# Patient Record
Sex: Female | Born: 1965 | Race: White | Hispanic: No | Marital: Married | Smoking: Former smoker
Health system: Southern US, Community
[De-identification: ages and names within clinical notes are randomized; demographics above are authoritative.]

## PROBLEM LIST (undated history)

## (undated) ENCOUNTER — Encounter: Payer: PRIVATE HEALTH INSURANCE | Attending: Pediatrics | Primary: Pediatrics

## (undated) ENCOUNTER — Ambulatory Visit: Payer: BLUE CROSS/BLUE SHIELD

## (undated) ENCOUNTER — Encounter

## (undated) ENCOUNTER — Ambulatory Visit

## (undated) ENCOUNTER — Ambulatory Visit: Payer: PRIVATE HEALTH INSURANCE | Attending: Internal Medicine | Primary: Internal Medicine

## (undated) ENCOUNTER — Ambulatory Visit: Attending: Pharmacist | Primary: Pharmacist

## (undated) ENCOUNTER — Telehealth

## (undated) ENCOUNTER — Telehealth: Attending: "Women's Health Care | Primary: "Women's Health Care

## (undated) ENCOUNTER — Encounter: Attending: Internal Medicine | Primary: Internal Medicine

## (undated) ENCOUNTER — Encounter: Attending: "Women's Health Care | Primary: "Women's Health Care

## (undated) ENCOUNTER — Ambulatory Visit: Payer: BLUE CROSS/BLUE SHIELD | Attending: "Women's Health Care | Primary: "Women's Health Care

## (undated) ENCOUNTER — Encounter: Attending: Pediatrics | Primary: Pediatrics

## (undated) ENCOUNTER — Telehealth: Attending: Pediatrics | Primary: Pediatrics

## (undated) ENCOUNTER — Ambulatory Visit: Payer: PRIVATE HEALTH INSURANCE

## (undated) ENCOUNTER — Ambulatory Visit
Payer: PRIVATE HEALTH INSURANCE | Attending: Student in an Organized Health Care Education/Training Program | Primary: Student in an Organized Health Care Education/Training Program

## (undated) ENCOUNTER — Ambulatory Visit: Payer: BLUE CROSS/BLUE SHIELD | Attending: Internal Medicine | Primary: Internal Medicine

## (undated) ENCOUNTER — Telehealth: Attending: Internal Medicine | Primary: Internal Medicine

## (undated) ENCOUNTER — Encounter: Payer: PRIVATE HEALTH INSURANCE | Attending: Dermatology | Primary: Dermatology

## (undated) ENCOUNTER — Ambulatory Visit: Attending: Pediatrics | Primary: Pediatrics

## (undated) ENCOUNTER — Ambulatory Visit: Attending: Internal Medicine | Primary: Internal Medicine

## (undated) ENCOUNTER — Ambulatory Visit: Payer: PRIVATE HEALTH INSURANCE | Attending: Family | Primary: Family

## (undated) ENCOUNTER — Ambulatory Visit: Payer: PRIVATE HEALTH INSURANCE | Attending: Dermatology | Primary: Dermatology

## (undated) ENCOUNTER — Telehealth: Attending: Dermatology | Primary: Dermatology

## (undated) ENCOUNTER — Encounter: Attending: Pain Medicine | Primary: Pain Medicine

## (undated) ENCOUNTER — Encounter
Attending: Student in an Organized Health Care Education/Training Program | Primary: Student in an Organized Health Care Education/Training Program

## (undated) ENCOUNTER — Inpatient Hospital Stay

## (undated) ENCOUNTER — Ambulatory Visit: Payer: PRIVATE HEALTH INSURANCE | Attending: Pain Medicine | Primary: Pain Medicine

## (undated) ENCOUNTER — Encounter: Attending: Dermatology | Primary: Dermatology

## (undated) DIAGNOSIS — L309 Dermatitis, unspecified: Secondary | ICD-10-CM

## (undated) DIAGNOSIS — F32A Depression, unspecified: Secondary | ICD-10-CM

## (undated) DIAGNOSIS — G43909 Migraine, unspecified, not intractable, without status migrainosus: Secondary | ICD-10-CM

## (undated) DIAGNOSIS — S99929A Unspecified injury of unspecified foot, initial encounter: Secondary | ICD-10-CM

## (undated) DIAGNOSIS — S99921A Unspecified injury of right foot, initial encounter: Secondary | ICD-10-CM

## (undated) DIAGNOSIS — F329 Major depressive disorder, single episode, unspecified: Secondary | ICD-10-CM

## (undated) DIAGNOSIS — F419 Anxiety disorder, unspecified: Secondary | ICD-10-CM

## (undated) HISTORY — DX: Dermatitis, unspecified: L30.9

## (undated) HISTORY — DX: Migraine, unspecified, not intractable, without status migrainosus: G43.909

## (undated) HISTORY — DX: Unspecified injury of right foot, initial encounter: S99.921A

## (undated) HISTORY — DX: Major depressive disorder, single episode, unspecified: F32.9

## (undated) HISTORY — DX: Unspecified injury of unspecified foot, initial encounter: S99.929A

## (undated) HISTORY — DX: Depression, unspecified: F32.A

## (undated) HISTORY — DX: Anxiety disorder, unspecified: F41.9

---

## 1983-10-14 HISTORY — PX: KIDNEY STONE SURGERY: SHX686

## 2000-10-13 HISTORY — PX: CHOLECYSTECTOMY: SHX55

## 2000-12-16 ENCOUNTER — Observation Stay (HOSPITAL_COMMUNITY): Admission: RE | Admit: 2000-12-16 | Discharge: 2000-12-17 | Payer: Self-pay

## 2005-10-13 HISTORY — PX: LIPOMA EXCISION: SHX5283

## 2006-02-17 ENCOUNTER — Ambulatory Visit: Payer: Self-pay | Admitting: Unknown Physician Specialty

## 2007-03-11 ENCOUNTER — Ambulatory Visit: Payer: Self-pay

## 2007-03-16 ENCOUNTER — Ambulatory Visit: Payer: Self-pay

## 2007-09-29 ENCOUNTER — Ambulatory Visit: Payer: Self-pay

## 2008-05-24 ENCOUNTER — Ambulatory Visit: Payer: Self-pay

## 2009-06-25 ENCOUNTER — Ambulatory Visit: Payer: Self-pay

## 2010-08-02 ENCOUNTER — Ambulatory Visit: Payer: Self-pay

## 2011-11-12 ENCOUNTER — Ambulatory Visit: Payer: Self-pay

## 2013-05-05 ENCOUNTER — Encounter: Payer: Self-pay | Admitting: *Deleted

## 2013-10-13 HISTORY — PX: COLONOSCOPY: SHX174

## 2014-02-10 DIAGNOSIS — S99929A Unspecified injury of unspecified foot, initial encounter: Secondary | ICD-10-CM

## 2014-02-10 HISTORY — DX: Unspecified injury of unspecified foot, initial encounter: S99.929A

## 2015-07-23 ENCOUNTER — Ambulatory Visit (INDEPENDENT_AMBULATORY_CARE_PROVIDER_SITE_OTHER): Payer: BLUE CROSS/BLUE SHIELD

## 2015-07-23 ENCOUNTER — Encounter: Payer: Self-pay | Admitting: Podiatry

## 2015-07-23 ENCOUNTER — Ambulatory Visit (INDEPENDENT_AMBULATORY_CARE_PROVIDER_SITE_OTHER): Payer: BLUE CROSS/BLUE SHIELD | Admitting: Podiatry

## 2015-07-23 VITALS — BP 153/96 | HR 93 | Resp 16

## 2015-07-23 DIAGNOSIS — M722 Plantar fascial fibromatosis: Secondary | ICD-10-CM | POA: Diagnosis not present

## 2015-07-23 MED ORDER — MELOXICAM 15 MG PO TABS: 15.0000 mg | ORAL_TABLET | Freq: Every day | ORAL | Status: DC

## 2015-07-23 MED ORDER — METHYLPREDNISOLONE 4 MG PO TBPK: ORAL_TABLET | ORAL | Status: DC

## 2015-07-23 NOTE — Progress Notes (Signed)
   Subjective:    Patient ID: Claudia Harper, female    DOB: [birth year], 49 y.o.   MRN: [id number]  HPI: She presents today as a 49 year old female with chief complaint of bilateral heel pain left greater than right. States that this is been going on now for approximately 2 months. States that she's tried exercising stretching ice shoe gear changes all to no avail. She states that mornings are particularly bad and after she's been sitting for a while. She denies any trauma to the feet.  Review of Systems  Psychiatric/Behavioral: The patient is nervous/anxious.   All other systems reviewed and are negative.      Objective:   Physical Exam: 49 year old female in no apparent distress vital signs stable alert and oriented 3. I have reviewed her past medical history medications allergies surgery social history and review of systems. Pulses are strongly palpable bilateral. Neurologic sensorium is intact per Semmes-Weinstein monofilament. Deep tendon reflexes are intact bilateral and muscle strength +5 over 5 dorsiflexion plantar flexors and inverters and everters all intrinsic musculature is intact. Orthopedic evaluation demonstrates all joints distal to the ankle for range of motion without crepitation. Mild hallux valgus bilateral mild hammertoes bilateral. She has pain on palpation medial calcaneal tubercle of her left heel. Radiographs 3 views bilateral demonstrates soft tissue increase in density at the plantar fascial calcaneal insertion site left greater than the right. Cutaneous evaluation demonstrates supple well-hydrated cutis no erythema edema cellulitis drainage or odor.        Assessment & Plan:  Assessment: Plantar fasciitis left greater than right.  Plan: Discussed etiology pathology conservative or surgical therapies. We discussed appropriate shoe gear stretching exercises ice therapy and shoe gear modifications. At this point I injected her left heel today with Kenalog and local and  aesthetic. She declined an injection to the right foot. I started her with a plantar fascial brace left to be followed by a night splint left. Started her on a Medrol Dosepak to be followed by meloxicam. I will follow-up with her in 1 month for reevaluation. She was provided with stretching exercises.

## 2015-07-23 NOTE — Patient Instructions (Signed)

## 2015-08-27 ENCOUNTER — Ambulatory Visit (INDEPENDENT_AMBULATORY_CARE_PROVIDER_SITE_OTHER): Payer: BLUE CROSS/BLUE SHIELD | Admitting: Podiatry

## 2015-08-27 ENCOUNTER — Encounter: Payer: Self-pay | Admitting: Podiatry

## 2015-08-27 VITALS — BP 142/89 | HR 88 | Resp 16

## 2015-08-27 DIAGNOSIS — M722 Plantar fascial fibromatosis: Secondary | ICD-10-CM

## 2015-08-27 NOTE — Progress Notes (Signed)
She presents today for follow-up of her left heel stating that it is approximately 60% improved. She will like to consider orthotics.  Objective: Vital signs are stable with mild hypertension. she is alert and oriented 3. Pulses are strongly palpable. She has pain on palpation medial calcaneal tubercle of her left heel.  Assessment: Chronic intractable plantar fasciitis left heel.  Plan: Discussed etiology pathology conservative versus surgical therapies. Injected the left heel. And she was scanned for set of orthotics.  Arbutus Pedodd Jamilynn Whitacre DPM

## 2015-08-30 ENCOUNTER — Telehealth: Payer: Self-pay | Admitting: *Deleted

## 2015-08-30 NOTE — Telephone Encounter (Signed)
Pt states she and Dr. Al CorpusHyatt discuss everything about her plantar fasciitis except her continuing the Meloxicam for which she has refills.  I told pt to complete the Meloxicam she currently had, if continues to have periods of pain she should complete an additional refill and if better use the other refills for flare-ups or make an appt to be seen.  Pt states understanding.

## 2015-09-13 ENCOUNTER — Ambulatory Visit: Payer: BLUE CROSS/BLUE SHIELD | Admitting: *Deleted

## 2015-09-13 DIAGNOSIS — M722 Plantar fascial fibromatosis: Secondary | ICD-10-CM

## 2015-09-13 NOTE — Progress Notes (Signed)
Patient ID: Claudia Harper, female   DOB: [birth year], 49 y.o.   MRN: [id number] Patient presents for orthotic pick up.  Verbal and written break in and wear instructions given.  Patient will follow up in 4 weeks if symptoms worsen or fail to improve.

## 2015-09-13 NOTE — Patient Instructions (Signed)

## 2015-10-17 ENCOUNTER — Encounter: Payer: Self-pay | Admitting: Podiatry

## 2015-10-17 ENCOUNTER — Ambulatory Visit (INDEPENDENT_AMBULATORY_CARE_PROVIDER_SITE_OTHER): Payer: BLUE CROSS/BLUE SHIELD | Admitting: Podiatry

## 2015-10-17 ENCOUNTER — Ambulatory Visit: Payer: BLUE CROSS/BLUE SHIELD | Admitting: Podiatry

## 2015-10-17 VITALS — BP 140/92 | HR 94 | Resp 16

## 2015-10-17 DIAGNOSIS — M722 Plantar fascial fibromatosis: Secondary | ICD-10-CM

## 2015-10-17 MED ORDER — MELOXICAM 15 MG PO TABS: 15.0000 mg | ORAL_TABLET | Freq: Every day | ORAL | Status: DC

## 2015-10-17 NOTE — Progress Notes (Signed)
She presents today for follow-up of her orthotics and her painful plantar fasciitis left. She states that I no longer have sharp pain and does have a dull ache in the heel. She states that the orthotics seem to be helping a little bit.  Objective: Vital signs stable alert and oriented 3. Pulses are palpable. Neurologic sensorium is intact. Pulses are strongly palpable no pain on palpation medial calcaneal tubercle of the left heel.  Assessment: Well-healed plantar fasciitis left.  Plan: Continue use of anti-inflammatories which I reordered for her today she will continue use of the plantar fascial brace as well as the orthotics. I will follow-up with her in 1 month at which time we may consider a second pair of orthotics as long she is doing better.

## 2015-11-05 ENCOUNTER — Telehealth: Payer: Self-pay | Admitting: Pain Medicine

## 2015-11-05 NOTE — Telephone Encounter (Signed)
error 

## 2015-11-28 ENCOUNTER — Ambulatory Visit: Payer: BLUE CROSS/BLUE SHIELD | Admitting: Podiatry

## 2016-12-10 ENCOUNTER — Other Ambulatory Visit: Payer: Self-pay | Admitting: Certified Nurse Midwife

## 2016-12-10 DIAGNOSIS — Z1231 Encounter for screening mammogram for malignant neoplasm of breast: Secondary | ICD-10-CM

## 2016-12-12 ENCOUNTER — Ambulatory Visit
Admission: RE | Admit: 2016-12-12 | Discharge: 2016-12-12 | Disposition: A | Discharge status: Home / Self Care | Payer: BLUE CROSS/BLUE SHIELD | Source: Ambulatory Visit | Attending: Certified Nurse Midwife | Admitting: Certified Nurse Midwife

## 2016-12-12 ENCOUNTER — Encounter: Payer: Self-pay | Admitting: Radiology

## 2016-12-12 DIAGNOSIS — Z1231 Encounter for screening mammogram for malignant neoplasm of breast: Secondary | ICD-10-CM | POA: Insufficient documentation

## 2017-01-08 ENCOUNTER — Encounter: Payer: Self-pay | Admitting: Certified Nurse Midwife

## 2017-01-08 ENCOUNTER — Ambulatory Visit (INDEPENDENT_AMBULATORY_CARE_PROVIDER_SITE_OTHER): Payer: BLUE CROSS/BLUE SHIELD | Admitting: Certified Nurse Midwife

## 2017-01-08 VITALS — BP 112/82 | HR 76 | Ht 62.0 in | Wt 158.0 lb

## 2017-01-08 DIAGNOSIS — Z01419 Encounter for gynecological examination (general) (routine) without abnormal findings: Secondary | ICD-10-CM

## 2017-01-08 DIAGNOSIS — L309 Dermatitis, unspecified: Secondary | ICD-10-CM | POA: Diagnosis not present

## 2017-01-08 DIAGNOSIS — R61 Generalized hyperhidrosis: Secondary | ICD-10-CM | POA: Diagnosis not present

## 2017-01-08 DIAGNOSIS — F419 Anxiety disorder, unspecified: Secondary | ICD-10-CM

## 2017-01-13 ENCOUNTER — Encounter: Payer: Self-pay | Admitting: Certified Nurse Midwife

## 2017-01-13 DIAGNOSIS — K635 Polyp of colon: Secondary | ICD-10-CM | POA: Insufficient documentation

## 2017-01-13 DIAGNOSIS — L309 Dermatitis, unspecified: Secondary | ICD-10-CM | POA: Insufficient documentation

## 2017-01-13 DIAGNOSIS — G43009 Migraine without aura, not intractable, without status migrainosus: Secondary | ICD-10-CM | POA: Insufficient documentation

## 2017-01-13 DIAGNOSIS — F419 Anxiety disorder, unspecified: Secondary | ICD-10-CM | POA: Insufficient documentation

## 2017-01-13 NOTE — Progress Notes (Signed)
Gynecology Annual Exam  PCP: Leim Fabry, MD  Chief Complaint:  Chief Complaint  Patient presents with  . Gynecologic Exam    c/o hormonal migraines   History of Present Illness: Patient is a 51 y.o. [id number] presents for annual exam. The patient reports having night sweats x 6 months.Her menses continue to be monthly, lasting 4 days, with medium flow. Denies dysmenorrhea. She also began having migraine headaches about 6-9 months ago. The migraines are right frontal temporal. In February she had 13 migraines in 19 days. Her headaches are not accompanied by nausea/vomiting, visual changes, paresthesias. She was diagnosed with migraine headaches last year and was prescribed Imitrex which is effective if taken early in the headache. Since her last visit 07/17/2015, she has also been treated for left plantar fasciitis which is 98% better. Her medication for anxiety was changed to Lexapro. Past medical history is also notable for eczema for which she uses topical steroids in her arm pits and her fingers. Her daughter is expecting in early August. She is sexually active and uses vasectomy for her contraception.  Last pap smear: 07/17/2015.  Result NIL/neg HRHPV. Hx of positive HRHPV in 2010 with three normal pap smears since then.  Last mammogram: 12/12/2016 .  Result negative. She does monthly self breast exams. There is no family history of breast or ovarian cancer.  Last colonoscopy was 2015 and was significant for a precancerous polyp. Next one due 2020.  She is a former smoker. Quit in 2009. Does drink 3 cocktails/week..  She has not been exercising due to her plantar fasciitis. Current BMI 28.90. She does get adequate calcium in her diet.    Review of Systems: Review of Systems  Constitutional: Negative for chills, fever and weight loss.       Positive for night sweats  HENT: Negative for congestion, sinus pain and sore throat.   Eyes: Negative for blurred vision and pain.    Respiratory: Negative for hemoptysis, shortness of breath and wheezing.   Cardiovascular: Negative for chest pain, palpitations and leg swelling.  Gastrointestinal: Negative for abdominal pain, blood in stool, diarrhea, heartburn, nausea and vomiting.  Genitourinary: Negative for dysuria, frequency, hematuria and urgency.       No menorrhagia, irregular menses, or intermenstrual bleeding  Musculoskeletal: Negative for back pain, joint pain and myalgias.  Skin: Negative for itching and rash.  Neurological: Positive for headaches. Negative for dizziness and tingling.  Endo/Heme/Allergies: Positive for environmental allergies. Negative for polydipsia. Does not bruise/bleed easily.       Negative for hirsutism   Psychiatric/Behavioral: Negative for depression. The patient is nervous/anxious and has insomnia.     Past Medical History:  Past Medical History:  Diagnosis Date  . Anxiety   . Depression   . Eczema   . Injury of foot including toes 02/2014   fracture of left fourth digit  . Migraines   . Right foot injury    fx of foot, tore tendon, and sprained right ankle 2014;    Past Surgical History:  Past Surgical History:  Procedure Laterality Date  . CHOLECYSTECTOMY  2002  . COLONOSCOPY  2015   polyps found, next one in 5 years  . KIDNEY STONE SURGERY  1985   done with cystoscopy  . LIPOMA EXCISION  2007   right inner thigh    Gynecologic History:  No LMP recorded. Patient is not currently having periods (Reason: Premenopausal).   Family History:  Family History  Problem Relation Age  of Onset  . Dementia Mother   . Hypertension Mother   . Diabetes Mother   . Diabetes Father   . Hypertension Father   . Heart block Father   . Colon cancer Maternal Grandmother 72  . Heart failure Maternal Grandmother   . Hypertension Maternal Grandmother   . Brain cancer Maternal Grandfather 34  . Hypertension Paternal Grandmother   . Leukemia Paternal Grandmother 69    Social  History:  Social History   Social History  . Marital status: Married    Spouse name: N/A  . Number of children: 2  . Years of education: N/A   Occupational History  . homemaker    Social History Main Topics  . Smoking status: Former Smoker    Years: 15.00    Types: Cigarettes    Start date: 1985    Quit date: 2009  . Smokeless tobacco: Never Used  . Alcohol use 0.0 oz/week     Comment: 3 cocktails/week  . Drug use: No  . Sexual activity: Yes    Partners: Male    Birth control/ protection: None, Surgical     Comment: vasectomy   Other Topics Concern  . Not on file   Social History Narrative  . No narrative on file    Allergies:  Allergies  Allergen Reactions  . Shellfish Allergy Nausea And Vomiting  . Sulfa Antibiotics Rash    Medications: Prior to Admission medications   Medication Sig Start Date End Date Taking? Authorizing Provider  betamethasone valerate (VALISONE) 0.1 % cream Apply topically 2 (two) times daily.   Yes Historical Provider, MD  clobetasol (TEMOVATE) 0.05 % external solution Apply 1 application topically 2 (two) times daily.   Yes Historical Provider, MD  fluticasone (FLONASE) 50 MCG/ACT nasal spray 2 sprays by Each Nare route daily. 05/17/13  Yes Historical Provider, MD  Ibuprofen (ADVIL MIGRAINE) 200 MG CAPS Take 2 capsules by mouth every 4 (four) hours as needed.   Yes Historical Provider, MD  cetirizine (ZYRTEC) 10 MG tablet Take by mouth.    Historical Provider, MD  escitalopram (LEXAPRO) 10 MG tablet  11/05/16   Historical Provider, MD  Melatonin 5 MG CAPS Take 5 mg by mouth once.    Historical Provider, MD  SUMAtriptan (IMITREX) 50 MG tablet  01/06/17   Historical Provider, MD    Physical Exam Vitals: Blood pressure 112/82, pulse 76, height  (1.575 m), weight 71.7 kg (158 lb). Physical Exam  Constitutional: She is oriented to person, place, and time. She appears well-developed and well-nourished.  HENT:  Head: Normocephalic and  atraumatic.  Neck: Normal range of motion. Neck supple. No thyromegaly present.  Cardiovascular: Normal rate and regular rhythm.   No murmur heard. Respiratory: Effort normal and breath sounds normal. She has no wheezes. She has no rales. She exhibits no tenderness. Right breast exhibits no inverted nipple, no mass, no nipple discharge, no skin change and no tenderness. Left breast exhibits no inverted nipple, no mass, no nipple discharge, no skin change and no tenderness.  GI: Soft. She exhibits no distension and no mass. There is no tenderness. There is no rebound and no guarding.  Genitourinary: Rectum normal. There is no rash, tenderness or lesion on the right labia. There is no rash, tenderness or lesion on the left labia. Cervix exhibits no motion tenderness, no discharge and no friability. Right adnexum displays no mass and no tenderness. Left adnexum displays no mass and no tenderness. No erythema, tenderness or  bleeding in the vagina.  Genitourinary Comments: Uterus: nonenlarged, normal contour Position: anteverted   mobile, non-tender   Musculoskeletal: Normal range of motion.  Lymphadenopathy:    She has no cervical adenopathy.    She has no axillary adenopathy.       Right: No inguinal and no supraclavicular adenopathy present.       Left: No inguinal and no supraclavicular adenopathy present.  No infraclavicular adenopathy  Neurological: She is alert and oriented to person, place, and time.  Skin: Skin is warm and dry. No rash noted.  Psychiatric: She has a normal mood and affect. Her behavior is normal. Judgment and thought content normal.        Assessment: 51 y.o. [id number] with normal well woman exam Night sweats may be due to perimenopausal vasomotor symptoms  Plan:   1) Mammogram  - recommend continued yearly screening mammogram  2) Pap smear - ASCCP guidelines and rational discussed.  Patient opts for every 3 year screening interval - Pap due next year  3)  Osteoporosis prevention- discussed calcium and vitamin D requirements. Encouraged exercise to help keep bones strong  4) Routine healthcare maintenance including cholesterol, diabetes screening per her PCP  5) Colonoscopy-UTD, next due 2020   6) Discussed treatment for vasomotor symptoms including OTC medications, and prescription meds, both estrogen containing and non estrogen medications.  7) Follow up 1 year for routine annual   Farrel Conners, [place]

## 2017-10-15 ENCOUNTER — Encounter
Admit: 2017-10-15 | Discharge: 2017-10-16 | Payer: PRIVATE HEALTH INSURANCE | Attending: Pediatrics | Primary: Pediatrics

## 2017-10-15 DIAGNOSIS — Z23 Encounter for immunization: Principal | ICD-10-CM

## 2017-10-15 DIAGNOSIS — G43001 Migraine without aura, not intractable, with status migrainosus: Secondary | ICD-10-CM

## 2017-10-15 MED ORDER — RIZATRIPTAN 5 MG DISINTEGRATING TABLET
ORAL_TABLET | Freq: Once | ORAL | 1 refills | 0.00000 days | Status: CP | PRN
Start: 2017-10-15 — End: 2017-10-21

## 2017-10-15 MED ORDER — NAPROXEN 500 MG TABLET
ORAL_TABLET | 11 refills | 0 days | Status: CP
Start: 2017-10-15 — End: ?

## 2017-10-21 MED ORDER — RIZATRIPTAN 5 MG DISINTEGRATING TABLET
ORAL_TABLET | Freq: Once | ORAL | 1 refills | 0 days | Status: CP | PRN
Start: 2017-10-21 — End: 2018-01-10

## 2018-01-11 MED ORDER — RIZATRIPTAN 5 MG DISINTEGRATING TABLET
ORAL_TABLET | 2 refills | 0 days | Status: CP
Start: 2018-01-11 — End: 2018-02-09

## 2018-02-09 MED ORDER — RIZATRIPTAN 5 MG DISINTEGRATING TABLET
ORAL_TABLET | 2 refills | 0 days | Status: CP
Start: 2018-02-09 — End: 2018-04-21

## 2018-03-12 ENCOUNTER — Other Ambulatory Visit: Payer: Self-pay | Admitting: Pediatrics

## 2018-03-12 DIAGNOSIS — Z1231 Encounter for screening mammogram for malignant neoplasm of breast: Secondary | ICD-10-CM

## 2018-04-14 MED ORDER — ESCITALOPRAM 10 MG TABLET
ORAL_TABLET | Freq: Every day | ORAL | 0 refills | 0.00000 days | Status: CP
Start: 2018-04-14 — End: 2018-04-21

## 2018-04-20 ENCOUNTER — Ambulatory Visit
Admission: RE | Admit: 2018-04-20 | Discharge: 2018-04-20 | Disposition: A | Discharge status: Home / Self Care | Payer: BLUE CROSS/BLUE SHIELD | Source: Ambulatory Visit | Attending: Pediatrics | Admitting: Pediatrics

## 2018-04-20 DIAGNOSIS — Z1231 Encounter for screening mammogram for malignant neoplasm of breast: Secondary | ICD-10-CM | POA: Diagnosis present

## 2018-04-21 ENCOUNTER — Ambulatory Visit
Admit: 2018-04-21 | Discharge: 2018-04-22 | Payer: PRIVATE HEALTH INSURANCE | Attending: Pediatrics | Primary: Pediatrics

## 2018-04-21 DIAGNOSIS — Z0000 Encounter for general adult medical examination without abnormal findings: Principal | ICD-10-CM

## 2018-04-21 MED ORDER — ESCITALOPRAM 10 MG TABLET
ORAL_TABLET | Freq: Every day | ORAL | 0 refills | 0 days | Status: CP
Start: 2018-04-21 — End: 2018-07-15

## 2018-04-21 MED ORDER — RIZATRIPTAN 5 MG DISINTEGRATING TABLET
ORAL_TABLET | Freq: Once | ORAL | 2 refills | 0 days | Status: CP | PRN
Start: 2018-04-21 — End: ?

## 2018-07-15 MED ORDER — ESCITALOPRAM 10 MG TABLET
1 refills | 0 days | Status: CP
Start: 2018-07-15 — End: 2018-12-30

## 2018-09-06 MED ORDER — RIZATRIPTAN 5 MG DISINTEGRATING TABLET
ORAL_TABLET | 1 refills | 0 days | Status: CP
Start: 2018-09-06 — End: ?

## 2018-10-29 MED ORDER — RIZATRIPTAN 5 MG DISINTEGRATING TABLET
ORAL_TABLET | 2 refills | 0 days | Status: CP
Start: 2018-10-29 — End: 2019-05-04

## 2018-12-30 MED ORDER — ESCITALOPRAM 10 MG TABLET
1 refills | 0 days | Status: CP
Start: 2018-12-30 — End: ?

## 2019-05-02 MED ORDER — FLUCONAZOLE 150 MG TABLET
ORAL_TABLET | Freq: Once | ORAL | 0 refills | 1 days | Status: CP
Start: 2019-05-02 — End: 2019-05-02

## 2019-05-04 MED ORDER — RIZATRIPTAN 5 MG DISINTEGRATING TABLET
ORAL_TABLET | 2 refills | 0 days | Status: CP
Start: 2019-05-04 — End: ?

## 2019-06-09 ENCOUNTER — Encounter: Admit: 2019-06-09 | Discharge: 2019-06-10 | Payer: PRIVATE HEALTH INSURANCE

## 2019-06-09 DIAGNOSIS — L601 Onycholysis: Secondary | ICD-10-CM

## 2019-06-09 DIAGNOSIS — D2239 Melanocytic nevi of other parts of face: Secondary | ICD-10-CM

## 2019-06-09 DIAGNOSIS — L249 Irritant contact dermatitis, unspecified cause: Secondary | ICD-10-CM

## 2019-06-09 DIAGNOSIS — L03019 Cellulitis of unspecified finger: Secondary | ICD-10-CM

## 2019-06-09 DIAGNOSIS — L309 Dermatitis, unspecified: Principal | ICD-10-CM

## 2019-06-09 DIAGNOSIS — D2261 Melanocytic nevi of right upper limb, including shoulder: Secondary | ICD-10-CM

## 2019-06-09 MED ORDER — CLOBETASOL 0.05 % TOPICAL OINTMENT
1 refills | 0 days | Status: CP
Start: 2019-06-09 — End: ?

## 2019-06-09 MED ORDER — MUPIROCIN 2 % TOPICAL OINTMENT
Freq: Two times a day (BID) | TOPICAL | 1 refills | 0.00000 days | Status: CP
Start: 2019-06-09 — End: ?

## 2019-06-09 MED ORDER — NYSTATIN 100,000 UNIT/GRAM TOPICAL OINTMENT
1 refills | 0 days | Status: CP
Start: 2019-06-09 — End: ?

## 2019-07-07 ENCOUNTER — Encounter: Admit: 2019-07-07 | Discharge: 2019-07-08 | Payer: PRIVATE HEALTH INSURANCE

## 2019-07-07 DIAGNOSIS — L309 Dermatitis, unspecified: Secondary | ICD-10-CM

## 2019-07-07 DIAGNOSIS — L03019 Cellulitis of unspecified finger: Secondary | ICD-10-CM

## 2019-07-08 ENCOUNTER — Other Ambulatory Visit: Payer: Self-pay | Admitting: Pediatrics

## 2019-07-08 DIAGNOSIS — Z1231 Encounter for screening mammogram for malignant neoplasm of breast: Secondary | ICD-10-CM

## 2019-07-18 ENCOUNTER — Ambulatory Visit: Payer: BLUE CROSS/BLUE SHIELD

## 2019-08-16 ENCOUNTER — Encounter
Admit: 2019-08-16 | Discharge: 2019-08-17 | Payer: PRIVATE HEALTH INSURANCE | Attending: Dermatology | Primary: Dermatology

## 2019-08-16 DIAGNOSIS — L03019 Cellulitis of unspecified finger: Principal | ICD-10-CM

## 2019-08-25 MED ORDER — CLOBETASOL 0.05 % TOPICAL OINTMENT
1 refills | 0 days | Status: CP
Start: 2019-08-25 — End: ?

## 2019-09-01 ENCOUNTER — Ambulatory Visit
Admission: RE | Admit: 2019-09-01 | Discharge: 2019-09-01 | Disposition: A | Discharge status: Home / Self Care | Payer: BLUE CROSS/BLUE SHIELD | Source: Ambulatory Visit | Attending: Pediatrics | Admitting: Pediatrics

## 2019-09-01 DIAGNOSIS — Z1231 Encounter for screening mammogram for malignant neoplasm of breast: Secondary | ICD-10-CM | POA: Diagnosis present

## 2019-09-06 ENCOUNTER — Ambulatory Visit
Admit: 2019-09-06 | Discharge: 2019-09-07 | Payer: PRIVATE HEALTH INSURANCE | Attending: Pediatrics | Primary: Pediatrics

## 2019-09-06 DIAGNOSIS — R7303 Prediabetes: Principal | ICD-10-CM

## 2019-09-06 DIAGNOSIS — Z0000 Encounter for general adult medical examination without abnormal findings: Principal | ICD-10-CM

## 2019-09-06 MED ORDER — NAPROXEN 500 MG TABLET
ORAL_TABLET | 11 refills | 0 days | Status: CP
Start: 2019-09-06 — End: ?

## 2019-09-06 MED ORDER — RIZATRIPTAN 5 MG DISINTEGRATING TABLET
ORAL_TABLET | Freq: Once | ORAL | 2 refills | 0.00000 days | Status: CP | PRN
Start: 2019-09-06 — End: ?

## 2019-09-06 MED ORDER — ESCITALOPRAM 10 MG TABLET
Freq: Every day | ORAL | 1 refills | 90.00000 days | Status: CP
Start: 2019-09-06 — End: ?

## 2020-01-03 MED ORDER — RIZATRIPTAN 5 MG DISINTEGRATING TABLET
ORAL_TABLET | 2 refills | 0 days | Status: CP
Start: 2020-01-03 — End: ?

## 2020-01-09 ENCOUNTER — Ambulatory Visit: Payer: BLUE CROSS/BLUE SHIELD | Attending: Internal Medicine

## 2020-01-09 DIAGNOSIS — Z23 Encounter for immunization: Secondary | ICD-10-CM

## 2020-01-09 NOTE — Progress Notes (Signed)
   Covid-19 Vaccination Clinic  Name:  Claudia Harper    MRN: [id number] DOB: 02/05/1966  01/09/2020  Claudia Harper was observed post Covid-19 immunization for 15 minutes without incident. She was provided with Vaccine Information Sheet and instruction to access the V-Safe system.   Claudia Harper was instructed to call 911 with any severe reactions post vaccine: Marland Kitchen Difficulty breathing  . Swelling of face and throat  . A fast heartbeat  . A bad rash all over body  . Dizziness and weakness   Immunizations Administered    Name Date Dose VIS Date Route   Pfizer COVID-19 Vaccine 01/09/2020  1:20 PM 0.3 mL 09/23/2019 Intramuscular   Manufacturer: [employer], [employer]   Lot: [id number]   NDC: [address]-8

## 2020-01-16 MED ORDER — ESCITALOPRAM 10 MG TABLET
ORAL_TABLET | 0 refills | 0 days | Status: CP
Start: 2020-01-16 — End: ?

## 2020-02-01 ENCOUNTER — Ambulatory Visit: Payer: BLUE CROSS/BLUE SHIELD

## 2020-02-08 ENCOUNTER — Ambulatory Visit: Payer: BLUE CROSS/BLUE SHIELD | Attending: Internal Medicine

## 2020-02-08 DIAGNOSIS — Z23 Encounter for immunization: Secondary | ICD-10-CM

## 2020-02-08 NOTE — Progress Notes (Signed)
   Covid-19 Vaccination Clinic  Name:  Claudia Harper    MRN: [id number] DOB: 1966-08-07  02/08/2020  [name] was observed post Covid-19 immunization for 15 minutes without incident. She was provided with Vaccine Information Sheet and instruction to access the V-Safe system.   [name] was instructed to call 911 with any severe reactions post vaccine: Marland Kitchen Difficulty breathing  . Swelling of face and throat  . A fast heartbeat  . A bad rash all over body  . Dizziness and weakness   Immunizations Administered    Name Date Dose VIS Date Route   Pfizer COVID-19 Vaccine 02/08/2020  3:44 PM 0.3 mL 12/07/2018 Intramuscular   Manufacturer: [employer], [employer]   Lot: [id number]   NDC: [address]-1

## 2020-03-22 ENCOUNTER — Encounter
Admit: 2020-03-22 | Discharge: 2020-03-22 | Disposition: A | Discharge status: Home / Self Care | Payer: PRIVATE HEALTH INSURANCE | Attending: Emergency Medicine

## 2020-03-22 MED ORDER — OXYCODONE 5 MG TABLET
ORAL_TABLET | Freq: Three times a day (TID) | ORAL | 0 refills | 3 days | Status: CP | PRN
Start: 2020-03-22 — End: 2020-03-27

## 2020-04-17 MED ORDER — ESCITALOPRAM 10 MG TABLET
ORAL_TABLET | 0 refills | 0 days | Status: CP
Start: 2020-04-17 — End: ?

## 2020-04-23 MED ORDER — RIZATRIPTAN 5 MG DISINTEGRATING TABLET
ORAL_TABLET | 2 refills | 0 days | Status: CP
Start: 2020-04-23 — End: ?

## 2020-05-17 IMAGING — MG DIGITAL SCREENING BILAT W/ TOMO W/ CAD
8 series · 8 of 24 positions shown · non-contrast
Comparison: Previous exam(s).

CLINICAL DATA: Screening.

EXAM:
DIGITAL SCREENING BILATERAL MAMMOGRAM WITH TOMO AND CAD

[R MLO synth-2D]
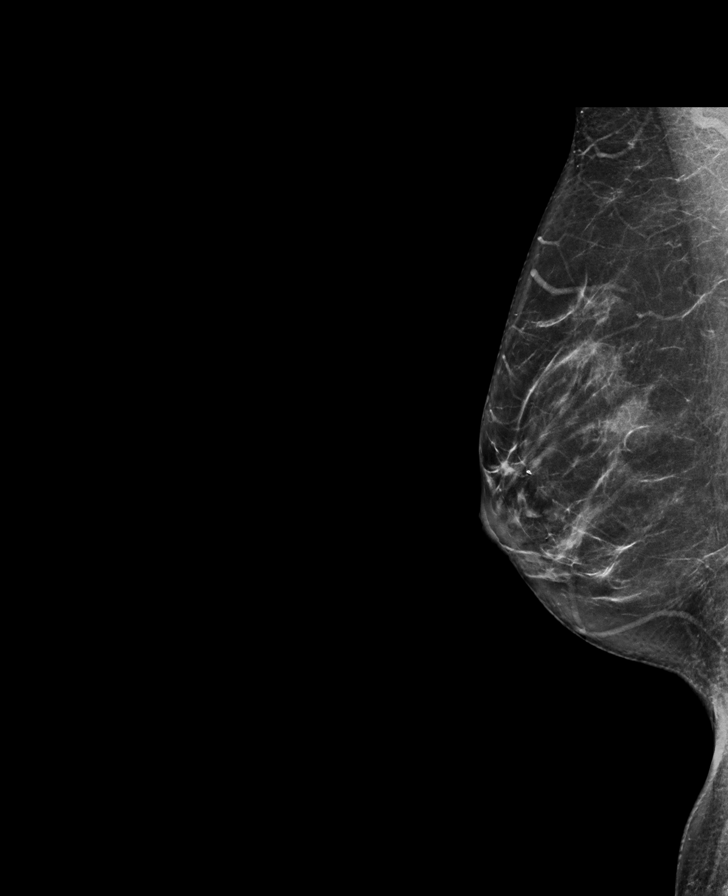

[L MLO synth-2D]
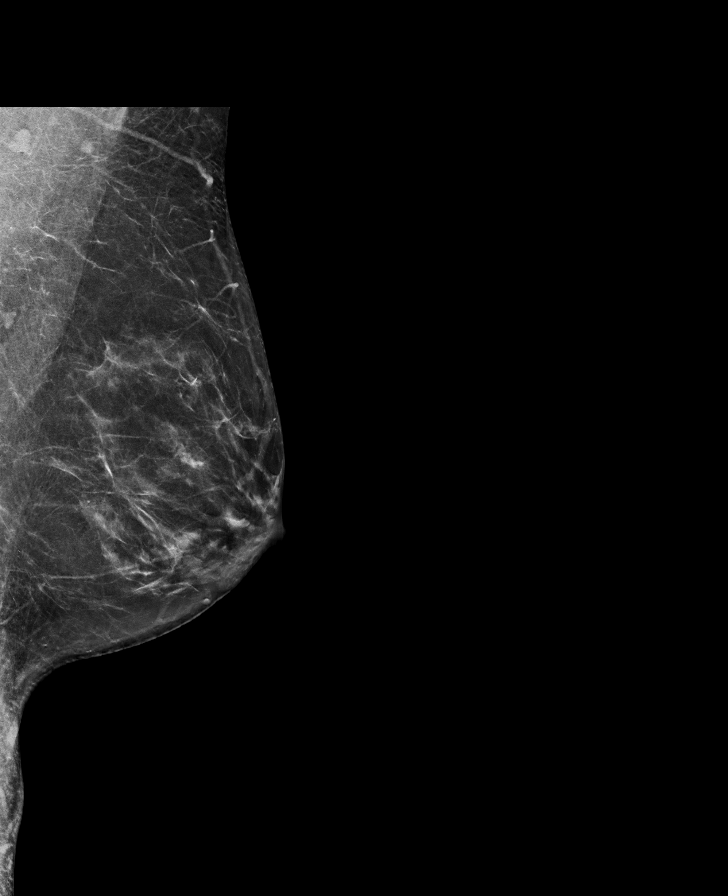

[R CC synth-2D]
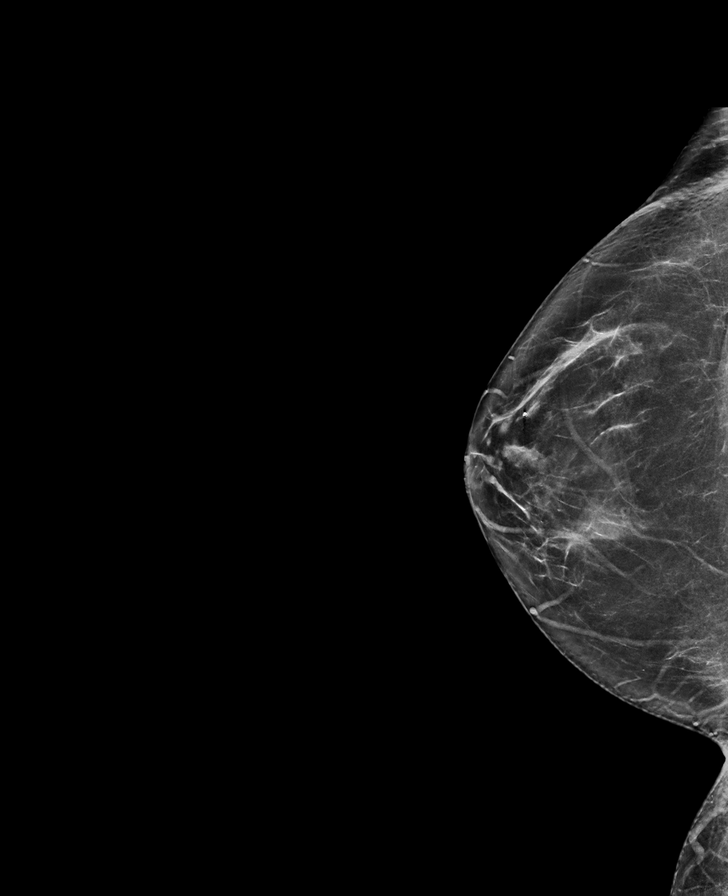

[L CC synth-2D]
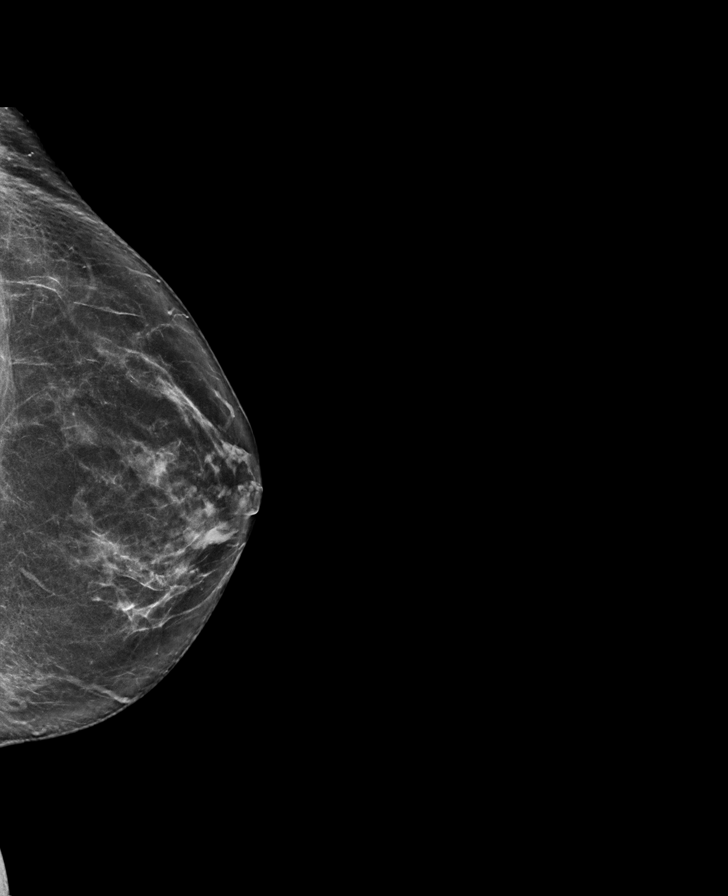

[L CC tomo · tomo slice 31/61.0]
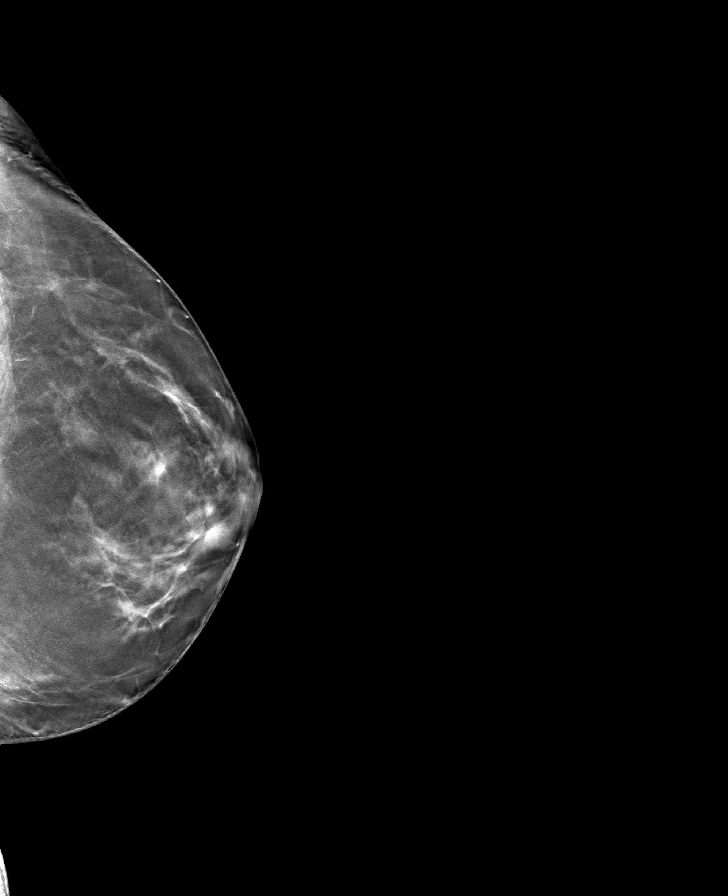

[R CC tomo · tomo slice 36/71.0]
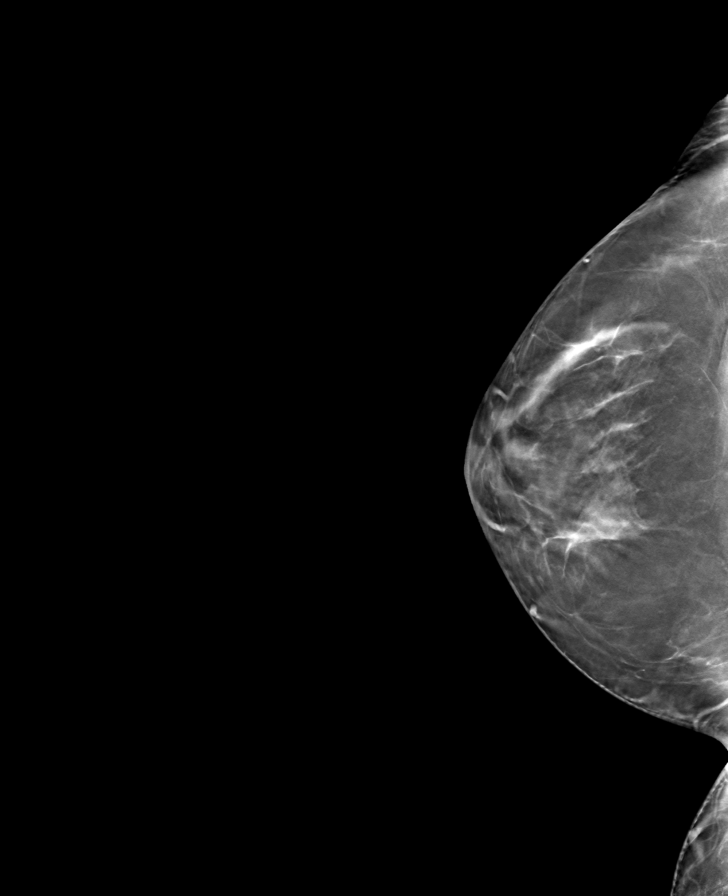

[R MLO tomo · tomo slice 35/68.0]
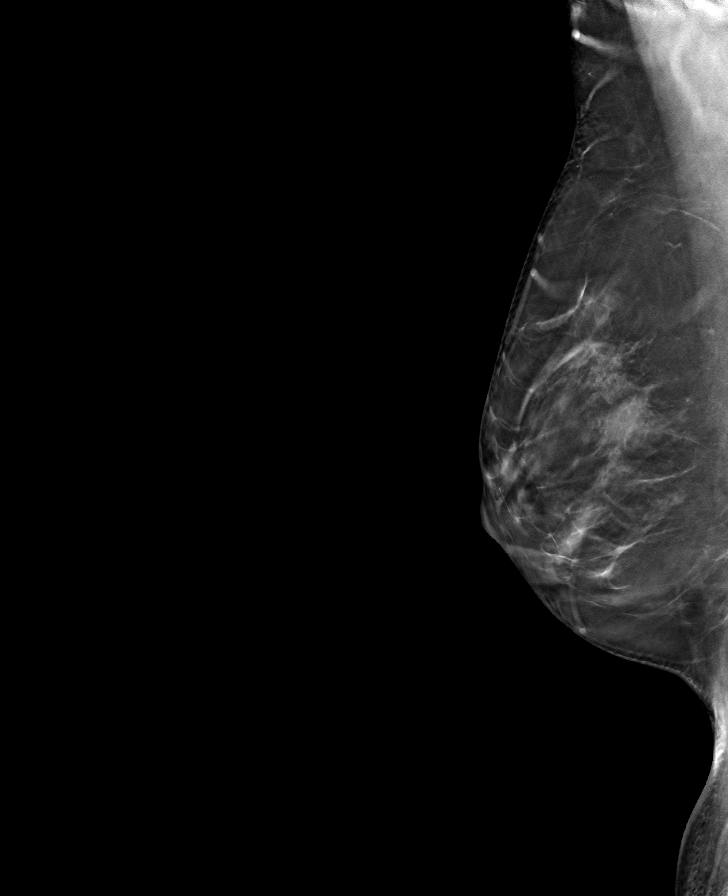

[L MLO tomo · tomo slice 35/69.0]
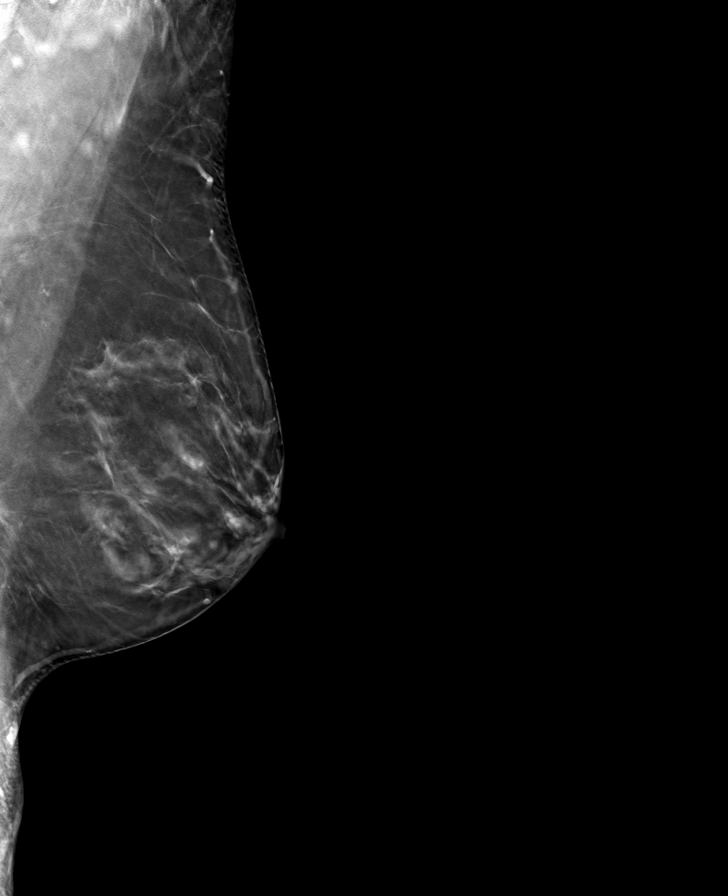

[8 of 24 positions shown; findings below may reference images not displayed]

ACR Breast Density Category c: The breast tissue is heterogeneously
dense, which may obscure small masses.
FINDINGS: There are no findings suspicious for malignancy. Images were
processed with CAD.
IMPRESSION: No mammographic evidence of malignancy. A result letter of this
screening mammogram will be mailed directly to the patient.

RECOMMENDATION:
Screening mammogram in one year. (Code:[id number])

BI-RADS CATEGORY  1: Negative.

## 2020-06-20 ENCOUNTER — Institutional Professional Consult (permissible substitution)
Admit: 2020-06-20 | Discharge: 2020-06-21 | Payer: PRIVATE HEALTH INSURANCE | Attending: Pediatrics | Primary: Pediatrics

## 2020-06-20 MED ORDER — RIZATRIPTAN 5 MG DISINTEGRATING TABLET
ORAL_TABLET | 2 refills | 0 days | Status: CP
Start: 2020-06-20 — End: 2020-06-20

## 2020-06-20 MED ORDER — NURTEC ODT 75 MG DISINTEGRATING TABLET
ORAL_TABLET | ORAL | 1 refills | 30.00000 days | Status: CP
Start: 2020-06-20 — End: 2020-08-19

## 2020-07-13 MED ORDER — RIZATRIPTAN 10 MG TABLET
ORAL_TABLET | Freq: Once | ORAL | 2 refills | 0 days | Status: CP | PRN
Start: 2020-07-13 — End: 2021-07-14

## 2020-07-17 DIAGNOSIS — G43001 Migraine without aura, not intractable, with status migrainosus: Principal | ICD-10-CM

## 2020-07-17 MED ORDER — AIMOVIG AUTOINJECTOR 70 MG/ML SUBCUTANEOUS AUTO-INJECTOR
SUBCUTANEOUS | 3 refills | 0 days | Status: CP
Start: 2020-07-17 — End: 2020-08-14

## 2020-08-04 DIAGNOSIS — G43001 Migraine without aura, not intractable, with status migrainosus: Principal | ICD-10-CM

## 2020-09-10 MED ORDER — RIZATRIPTAN 10 MG TABLET
ORAL_TABLET | 2 refills | 0 days | Status: CP
Start: 2020-09-10 — End: 2020-10-18

## 2020-10-15 ENCOUNTER — Other Ambulatory Visit: Payer: Self-pay | Admitting: Pediatrics

## 2020-10-18 DIAGNOSIS — Z1211 Encounter for screening for malignant neoplasm of colon: Principal | ICD-10-CM

## 2020-10-18 DIAGNOSIS — Z1231 Encounter for screening mammogram for malignant neoplasm of breast: Principal | ICD-10-CM

## 2020-10-18 MED ORDER — RIZATRIPTAN 10 MG TABLET
ORAL_TABLET | 2 refills | 0 days | Status: CP
Start: 2020-10-18 — End: ?

## 2020-10-23 MED ORDER — ESCITALOPRAM 10 MG TABLET
ORAL_TABLET | 0 refills | 0 days | Status: CP
Start: 2020-10-23 — End: ?

## 2020-11-08 ENCOUNTER — Ambulatory Visit
Admission: EM | Admit: 2020-11-08 | Discharge: 2020-11-08 | Disposition: A | Discharge status: Home / Self Care | Payer: BLUE CROSS/BLUE SHIELD | Attending: Sports Medicine | Admitting: Sports Medicine

## 2020-11-08 ENCOUNTER — Encounter: Payer: Self-pay | Admitting: Emergency Medicine

## 2020-11-08 ENCOUNTER — Other Ambulatory Visit: Payer: Self-pay

## 2020-11-08 DIAGNOSIS — R059 Cough, unspecified: Secondary | ICD-10-CM | POA: Insufficient documentation

## 2020-11-08 DIAGNOSIS — R509 Fever, unspecified: Secondary | ICD-10-CM | POA: Insufficient documentation

## 2020-11-08 DIAGNOSIS — T50Z95A Adverse effect of other vaccines and biological substances, initial encounter: Secondary | ICD-10-CM | POA: Insufficient documentation

## 2020-11-08 DIAGNOSIS — Z87891 Personal history of nicotine dependence: Secondary | ICD-10-CM | POA: Insufficient documentation

## 2020-11-08 DIAGNOSIS — J069 Acute upper respiratory infection, unspecified: Secondary | ICD-10-CM | POA: Diagnosis present

## 2020-11-08 DIAGNOSIS — B349 Viral infection, unspecified: Secondary | ICD-10-CM | POA: Diagnosis present

## 2020-11-08 DIAGNOSIS — Z20822 Contact with and (suspected) exposure to covid-19: Secondary | ICD-10-CM | POA: Insufficient documentation

## 2020-11-08 DIAGNOSIS — R52 Pain, unspecified: Secondary | ICD-10-CM | POA: Insufficient documentation

## 2020-11-08 LAB — SARS CORONAVIRUS 2 (TAT 6-24 HRS): SARS Coronavirus 2: NEGATIVE

## 2020-11-08 LAB — RAPID INFLUENZA A&B ANTIGENS
Influenza A (ARMC): NEGATIVE
Influenza B (ARMC): NEGATIVE

## 2020-11-08 NOTE — Discharge Instructions (Addendum)
Rest,push fluids, may alternate tylenol, ibuprofen as label directed. Your results will be in my chart. Quarantine in meantime for 5 days until results received. If you develop shortness of breath, unable to keep fluids down, etc go to ER.

## 2020-11-08 NOTE — ED Triage Notes (Signed)
Patient c/o cough, congestion, bodyaches and HAs, fatigue and fever that started 2 days ago.

## 2020-11-08 NOTE — ED Provider Notes (Signed)
[facility]    CSN: [id number] Arrival date & time: 11/08/20  7253      History   Chief Complaint Chief Complaint  Patient presents with  . Fatigue  . Cough    HPI Claudia Harper is a 55 y.o. female.   55 year old female presents to urgent care with chief complaint of headache body aches fever x2 days, patient received vaccines however is not received a booster.  Denies recent illness exposure.  Treating symptoms with Alka-Seltzer cold.  Patient states she is able to keep fluids down.  The history is provided by the patient. No language interpreter was used.    Past Medical History:  Diagnosis Date  . Anxiety   . Depression   . Eczema   . Injury of foot including toes 02/2014   fracture of left fourth digit  . Migraines   . Right foot injury    fx of foot, tore tendon, and sprained right ankle 2014;    Patient Active Problem List   Diagnosis Date Noted  . Nonspecific syndrome suggestive of viral illness 11/08/2020  . Acute URI 11/08/2020  . Migraine headache without aura 01/13/2017  . Eczema 01/13/2017  . Anxiety 01/13/2017  . Colon polyps 01/13/2017    Past Surgical History:  Procedure Laterality Date  . CHOLECYSTECTOMY  2002  . COLONOSCOPY  2015   polyps found, next one in 5 years  . KIDNEY STONE SURGERY  1985   done with cystoscopy  . LIPOMA EXCISION  2007   right inner thigh    OB History    Gravida  2   Para  2   Term  2   Preterm      AB      Living  2     SAB      IAB      Ectopic      Multiple      Live Births  2        Obstetric Comments  Patient hospitalized at 34 weeks with G1 for viral illness.         Home Medications    Prior to Admission medications   Medication Sig Start Date End Date Taking? Authorizing Provider  clobetasol (TEMOVATE) 0.05 % external solution Apply 1 application topically 2 (two) times daily.   Yes [provider]  escitalopram (LEXAPRO) 10 MG tablet  11/05/16  Yes  [provider]  rizatriptan (MAXALT) 10 MG tablet TAKE 1 TABLET BY MOUTH AS NEEDED MIGRAINE MAY REPEAT IN 2 HRS IF NEEDED 10/18/20  Yes [provider]  SUMAtriptan (IMITREX) 50 MG tablet  01/06/17  Yes [provider]  betamethasone valerate (VALISONE) 0.1 % cream Apply topically 2 (two) times daily.    [provider]  cetirizine (ZYRTEC) 10 MG tablet Take by mouth.    [provider]  fluticasone (FLONASE) 50 MCG/ACT nasal spray 2 sprays by Each Nare route daily. 05/17/13   [provider]  Ibuprofen (ADVIL MIGRAINE) 200 MG CAPS Take 2 capsules by mouth every 4 (four) hours as needed.    [provider]  Melatonin 5 MG CAPS Take 5 mg by mouth once.    [provider]    Family History Family History  Problem Relation Age of Onset  . Dementia Mother   . Hypertension Mother   . Diabetes Mother   . Diabetes Father   . Hypertension Father   . Heart block Father   .  Colon cancer Maternal Grandmother 72  . Heart failure Maternal Grandmother   . Hypertension Maternal Grandmother   . Brain cancer Maternal Grandfather 65  . Hypertension Paternal Grandmother   . Leukemia Paternal Grandmother 79  . Breast cancer Neg Hx     Social History Social History   Tobacco Use  . Smoking status: Former Smoker    Years: 15.00    Types: Cigarettes    Start date: 1985    Quit date: 2009    Years since quitting: 13.0  . Smokeless tobacco: Never Used  Vaping Use  . Vaping Use: Never used  Substance Use Topics  . Alcohol use: Yes    Alcohol/week: 0.0 standard drinks    Comment: 3 cocktails/week  . Drug use: No     Allergies   Shellfish allergy and Sulfa antibiotics   Review of Systems Review of Systems  Constitutional: Positive for fever.  HENT: Positive for congestion and sinus pressure.   Gastrointestinal: Negative for nausea and vomiting.  Neurological: Positive for headaches.  All other systems reviewed and are  negative.    Physical Exam Triage Vital Signs ED Triage Vitals  Enc Vitals Group     BP      Pulse      Resp      Temp      Temp src      SpO2      Weight      Height      Head Circumference      Peak Flow      Pain Score      Pain Loc      Pain Edu?      Excl. in GC?    No data found.  Updated Vital Signs BP 123/78 (BP Location: Right Arm)   Pulse (!) 103   Temp 97.9 F (36.6 C) (Oral)   Resp 14   Ht 5\' 1"  (1.549 m)   Wt 160 lb (72.6 kg)   LMP 10/10/2020 (Approximate)   SpO2 95%   BMI 30.23 kg/m   Visual Acuity Right Eye Distance:   Left Eye Distance:   Bilateral Distance:    Right Eye Near:   Left Eye Near:    Bilateral Near:     Physical Exam Vitals and nursing note reviewed.  Constitutional:      General: She is active. She is not in acute distress.    Appearance: She is well-developed and well-nourished.  HENT:     Head: Normocephalic.     Right Ear: Tympanic membrane normal.     Left Ear: Tympanic membrane normal.     Nose: Congestion present.     Mouth/Throat:     Mouth: Oropharynx is clear and moist. Mucous membranes are moist.     Pharynx: Oropharynx is clear.  Eyes:     General: Lids are normal.     Extraocular Movements: EOM normal.     Conjunctiva/sclera: Conjunctivae normal.     Pupils: Pupils are equal, round, and reactive to light.  Neck:     Trachea: No tracheal deviation.  Cardiovascular:     Rate and Rhythm: Regular rhythm.     Pulses: Normal pulses.     Heart sounds: Normal heart sounds. No murmur heard.   Pulmonary:     Effort: Pulmonary effort is normal.     Breath sounds: Normal breath sounds.  Abdominal:     General: Bowel sounds are normal.     Palpations: Abdomen is  soft.     Tenderness: There is no abdominal tenderness.  Musculoskeletal:        General: Normal range of motion.     Cervical back: Normal range of motion.  Lymphadenopathy:     Cervical: No cervical adenopathy.  Skin:    General: Skin is warm  and dry.     Findings: No rash.  Neurological:     General: No focal deficit present.     Mental Status: She is alert and oriented to person, place, and time.     GCS: GCS eye subscore is 4. GCS verbal subscore is 5. GCS motor subscore is 6.  Psychiatric:        Mood and Affect: Mood and affect normal.        Speech: Speech normal.        Behavior: Behavior normal. Behavior is cooperative.      UC Treatments / Results  Labs (all labs ordered are listed, but only abnormal results are displayed) Labs Reviewed  RAPID INFLUENZA A&B ANTIGENS  SARS CORONAVIRUS 2 (TAT 6-24 HRS)    EKG   Radiology No results found.  Procedures Procedures (including critical care time)  Medications Ordered in UC Medications - No data to display  Initial Impression / Assessment and Plan / UC Course  I have reviewed the triage vital signs and the nursing notes.  Pertinent labs & imaging results that were available during my care of the patient were reviewed by me and considered in my medical decision making (see chart for details).      Final Clinical Impressions(s) / UC Diagnoses   Final diagnoses:  Acute URI  Nonspecific syndrome suggestive of viral illness     Discharge Instructions     Rest,push fluids, may alternate tylenol, ibuprofen as label directed. Your results will be in my chart. Quarantine in meantime for 5 days until results received. If you develop shortness of breath, unable to keep fluids down, etc go to ER.     ED Prescriptions    None     PDMP not reviewed this encounter.   Clancy Gourd, NP 11/08/20 1054

## 2020-11-12 ENCOUNTER — Other Ambulatory Visit: Payer: Self-pay

## 2020-11-12 ENCOUNTER — Encounter: Payer: Self-pay | Admitting: Emergency Medicine

## 2020-11-12 ENCOUNTER — Ambulatory Visit
Admission: EM | Admit: 2020-11-12 | Discharge: 2020-11-12 | Disposition: A | Discharge status: Home / Self Care | Payer: Self-pay | Attending: Family | Admitting: Family

## 2020-11-12 DIAGNOSIS — R059 Cough, unspecified: Secondary | ICD-10-CM

## 2020-11-12 DIAGNOSIS — R062 Wheezing: Secondary | ICD-10-CM

## 2020-11-12 MED ORDER — ALBUTEROL SULFATE HFA 108 (90 BASE) MCG/ACT IN AERS: 2.0000 | INHALATION_SPRAY | Freq: Four times a day (QID) | RESPIRATORY_TRACT | 0 refills | Status: DC | PRN

## 2020-11-12 MED ORDER — PREDNISONE 20 MG PO TABS: 40.0000 mg | ORAL_TABLET | Freq: Every day | ORAL | 0 refills | Status: AC

## 2020-11-12 MED ORDER — GUAIFENESIN-CODEINE 100-10 MG/5ML PO SOLN: 10.0000 mL | Freq: Four times a day (QID) | ORAL | 0 refills | Status: DC | PRN

## 2020-11-12 NOTE — ED Triage Notes (Signed)
Patient states she was seen last Thursday for nasal congestion and cough. Both flu and COVID test were negative. She states her symptoms have improved but she still has a cough.Marland Kitchen

## 2020-11-12 NOTE — ED Provider Notes (Signed)
[facility]    CSN: [id number] Arrival date & time: 11/12/20  1105      History   Chief Complaint Chief Complaint  Patient presents with  . Cough    HPI Claudia Harper is a 55 y.o. female.   55 year old female presents with persistent cough and sinus congestion. Was seen here at Urgent Care on 1/27 for probable viral URI. Had negative COVID and flu tests at that time. Sinus congestion has improved some but now more lingering cough and tightness in chest. Unable to sleep due to cough. Has take OTC Alka Seltzer and Tylenol cold and flu with some relief. No specific cough medication. Denies any fever now, but continues with headache, ear pressure, throat irritation and some chest congestion. No GI symptoms. No history of asthma. Has been vaccinated against COVID 19. Other chronic health issues include migraine headaches, mood disorder/anxiety and environmental allergies. Currently on Lexapro daily and Maxalt and Flonase prn.   The history is provided by the patient.    Past Medical History:  Diagnosis Date  . Anxiety   . Depression   . Eczema   . Injury of foot including toes 02/2014   fracture of left fourth digit  . Migraines   . Right foot injury    fx of foot, tore tendon, and sprained right ankle 2014;    Patient Active Problem List   Diagnosis Date Noted  . Nonspecific syndrome suggestive of viral illness 11/08/2020  . Acute URI 11/08/2020  . Migraine headache without aura 01/13/2017  . Eczema 01/13/2017  . Anxiety 01/13/2017  . Colon polyps 01/13/2017    Past Surgical History:  Procedure Laterality Date  . CHOLECYSTECTOMY  2002  . COLONOSCOPY  2015   polyps found, next one in 5 years  . KIDNEY STONE SURGERY  1985   done with cystoscopy  . LIPOMA EXCISION  2007   right inner thigh    OB History    Gravida  2   Para  2   Term  2   Preterm      AB      Living  2     SAB      IAB      Ectopic      Multiple      Live Births  2         Obstetric Comments  Patient hospitalized at 34 weeks with G1 for viral illness.         Home Medications    Prior to Admission medications   Medication Sig Start Date End Date Taking? Authorizing Provider  albuterol (VENTOLIN HFA) 108 (90 Base) MCG/ACT inhaler Inhale 2 puffs into the lungs every 6 (six) hours as needed for wheezing or shortness of breath. 11/12/20  Yes Tayloranne Lekas, Ali Lowe, NP  cetirizine (ZYRTEC) 10 MG tablet Take by mouth.   Yes [provider]  escitalopram (LEXAPRO) 10 MG tablet  11/05/16  Yes [provider]  fluticasone (FLONASE) 50 MCG/ACT nasal spray 2 sprays by Each Nare route daily. 05/17/13  Yes [provider]  guaiFENesin-codeine 100-10 MG/5ML syrup Take 10 mLs by mouth every 6 (six) hours as needed for cough. 11/12/20  Yes Braxxton Stoudt, Ali Lowe, NP  predniSONE (DELTASONE) 20 MG tablet Take 2 tablets (40 mg total) by mouth daily for 5 days. 11/12/20 11/17/20 Yes Dionisio Aragones, Ali Lowe, NP  rizatriptan (MAXALT) 10 MG tablet TAKE 1 TABLET BY MOUTH AS NEEDED MIGRAINE MAY REPEAT IN  2 HRS IF NEEDED 10/18/20  Yes [provider]  SUMAtriptan (IMITREX) 50 MG tablet  01/06/17 11/12/20 Yes [provider]    Family History Family History  Problem Relation Age of Onset  . Dementia Mother   . Hypertension Mother   . Diabetes Mother   . Diabetes Father   . Hypertension Father   . Heart block Father   . Colon cancer Maternal Grandmother 72  . Heart failure Maternal Grandmother   . Hypertension Maternal Grandmother   . Brain cancer Maternal Grandfather 3  . Hypertension Paternal Grandmother   . Leukemia Paternal Grandmother 66  . Breast cancer Neg Hx     Social History Social History   Tobacco Use  . Smoking status: Former Smoker    Years: 15.00    Types: Cigarettes    Start date: 1985    Quit date: 2009    Years since quitting: 13.0  . Smokeless tobacco: Never Used  Vaping Use  . Vaping Use: Never used  Substance Use  Topics  . Alcohol use: Yes    Alcohol/week: 0.0 standard drinks    Comment: 3 cocktails/week  . Drug use: No     Allergies   Shellfish allergy and Sulfa antibiotics   Review of Systems Review of Systems  Constitutional: Positive for fatigue. Negative for appetite change, chills and fever (no longer).  HENT: Positive for congestion, sinus pressure and sore throat (irritated). Negative for ear discharge, facial swelling, mouth sores, postnasal drip, rhinorrhea, sinus pain and trouble swallowing. Ear pain: ear pressure.   Eyes: Negative for pain, discharge, redness and itching.  Respiratory: Positive for cough and chest tightness. Negative for shortness of breath.   Gastrointestinal: Negative for nausea and vomiting.  Musculoskeletal: Negative for neck pain and neck stiffness.  Skin: Negative for color change and rash.  Allergic/Immunologic: Positive for environmental allergies and food allergies. Negative for immunocompromised state.  Neurological: Positive for headaches. Negative for dizziness, tremors, seizures, syncope, speech difficulty, weakness, light-headedness and numbness.  Hematological: Negative for adenopathy. Does not bruise/bleed easily.  Psychiatric/Behavioral: Positive for sleep disturbance.     Physical Exam Triage Vital Signs ED Triage Vitals  Enc Vitals Group     BP 11/12/20 1132 (!) 139/93     Pulse Rate 11/12/20 1132 84     Resp 11/12/20 1132 18     Temp 11/12/20 1132 98.4 F (36.9 C)     Temp Source 11/12/20 1132 Oral     SpO2 11/12/20 1132 97 %     Weight 11/12/20 1130 160 lb 0.9 oz (72.6 kg)     Height 11/12/20 1130 5\' 1"  (1.549 m)     Head Circumference --      Peak Flow --      Pain Score 11/12/20 1130 3     Pain Loc --      Pain Edu? --      Excl. in GC? --    No data found.  Updated Vital Signs BP (!) 139/93 (BP Location: Right Arm)   Pulse 84   Temp 98.4 F (36.9 C) (Oral)   Resp 18   Ht 5\' 1"  (1.549 m)   Wt 160 lb 0.9 oz (72.6 kg)    SpO2 97%   BMI 30.24 kg/m   Visual Acuity Right Eye Distance:   Left Eye Distance:   Bilateral Distance:    Right Eye Near:   Left Eye Near:    Bilateral Near:     Physical  Exam Vitals and nursing note reviewed.  Constitutional:      General: She is awake. She is not in acute distress.    Appearance: She is well-developed and well-groomed. She is ill-appearing.     Comments: She is sitting on the exam table in no acute distress but appears tired and ill.   HENT:     Head: Normocephalic and atraumatic.     Right Ear: Hearing, ear canal and external ear normal. Tympanic membrane is bulging. Tympanic membrane is not injected, perforated or erythematous.     Left Ear: Hearing, ear canal and external ear normal. Tympanic membrane is bulging. Tympanic membrane is not injected, perforated or erythematous.     Nose: Congestion present.     Right Sinus: Frontal sinus tenderness present. No maxillary sinus tenderness.     Left Sinus: Frontal sinus tenderness present. No maxillary sinus tenderness.     Mouth/Throat:     Lips: Pink.     Mouth: Mucous membranes are moist.     Pharynx: Uvula midline. Posterior oropharyngeal erythema present. No pharyngeal swelling, oropharyngeal exudate or uvula swelling.  Eyes:     Extraocular Movements: Extraocular movements intact.     Conjunctiva/sclera: Conjunctivae normal.  Cardiovascular:     Rate and Rhythm: Normal rate and regular rhythm.     Heart sounds: Normal heart sounds. No murmur heard.   Pulmonary:     Effort: Pulmonary effort is normal. No respiratory distress.     Breath sounds: Normal air entry. No decreased air movement. Examination of the right-upper field reveals decreased breath sounds and wheezing. Examination of the left-upper field reveals decreased breath sounds and wheezing. Examination of the right-middle field reveals decreased breath sounds. Examination of the right-lower field reveals decreased breath sounds. Examination  of the left-lower field reveals decreased breath sounds. Decreased breath sounds and wheezing present. No rhonchi or rales.  Musculoskeletal:     Cervical back: Normal range of motion and neck supple. No rigidity.  Lymphadenopathy:     Cervical: No cervical adenopathy.  Skin:    General: Skin is warm and dry.     Capillary Refill: Capillary refill takes less than 2 seconds.     Findings: No rash.  Neurological:     General: No focal deficit present.     Mental Status: She is alert and oriented to person, place, and time.  Psychiatric:        Mood and Affect: Mood normal.        Behavior: Behavior normal. Behavior is cooperative.        Thought Content: Thought content normal.        Judgment: Judgment normal.      UC Treatments / Results  Labs (all labs ordered are listed, but only abnormal results are displayed) Labs Reviewed - No data to display  EKG   Radiology No results found.  Procedures Procedures (including critical care time)  Medications Ordered in UC Medications - No data to display  Initial Impression / Assessment and Plan / UC Course  I have reviewed the triage vital signs and the nursing notes.  Pertinent labs & imaging results that were available during my care of the patient were reviewed by me and considered in my medical decision making (see chart for details).    Reviewed with patient that she appears to have some cough and bronchial irritation from recent viral URI. Still feel that she has a viral illness and does not need antibiotics. Do not feel  chest x-ray is needed at this time. Patient has had sinus infections in the past and agrees that symptoms do not appear similar to sinusitis. Discussed that she does have some wheezing and chest tightness. Recommend start Prednisone 40mg  daily for 5 days then stop to help with sinus and chest inflammation. Recommend Albuterol inhaler 2 puffs every 6 hours as needed for cough or wheezing. (Recommend GoodRx  coupons) Since she is self-pay and unable to afford Tussionex, will trial Guaifenesin-Codeine cough syrup 2 teaspoons every 6 hours as needed for cough- especially at night to help with sleep. Rest. Continue to push fluids to help thin out mucus. May continue OTC Tylenol cold medication as needed for congestion. Follow-up in 4 to 5 days if not improving.    Final Clinical Impressions(s) / UC Diagnoses   Final diagnoses:  Cough  Wheezing     Discharge Instructions     Recommend start Prednisone 40mg  daily for 5 days to help with sinus and chest inflammation. Use Albuterol inhaler 2 puffs every 6 hours as needed for wheezing and cough. May also use Guaifenesin-Codeine cough syrup- take 2 teaspoons every 6 hours as needed for cough- may cause drowsiness. Continue to push fluids to help thin out mucus. Rest. Follow-up in 4 to 5 days if not improving.     ED Prescriptions    Medication Sig Dispense Auth. Provider   predniSONE (DELTASONE) 20 MG tablet Take 2 tablets (40 mg total) by mouth daily for 5 days. 10 tablet , NP   guaiFENesin-codeine 100-10 MG/5ML syrup Take 10 mLs by mouth every 6 (six) hours as needed for cough. 118 mL , NP   albuterol (VENTOLIN HFA) 108 (90 Base) MCG/ACT inhaler Inhale 2 puffs into the lungs every 6 (six) hours as needed for wheezing or shortness of breath. 18 g Sudie Grumbling, NP     PDMP was reviewed this encounter. Last active Rx was in June 2021 for Oxycodone and previous Rx for Tramadol in March 2021. No other active Rx. I feel the benefits outweigh the risks for a controlled cough medication at this time.    July 2021, NP 11/12/20 2106

## 2020-11-12 NOTE — Discharge Instructions (Signed)
Recommend start Prednisone 40mg  daily for 5 days to help with sinus and chest inflammation. Use Albuterol inhaler 2 puffs every 6 hours as needed for wheezing and cough. May also use Guaifenesin-Codeine cough syrup- take 2 teaspoons every 6 hours as needed for cough- may cause drowsiness. Continue to push fluids to help thin out mucus. Rest. Follow-up in 4 to 5 days if not improving.

## 2020-12-11 ENCOUNTER — Other Ambulatory Visit: Payer: Self-pay | Admitting: Pediatrics

## 2020-12-11 DIAGNOSIS — Z1231 Encounter for screening mammogram for malignant neoplasm of breast: Secondary | ICD-10-CM

## 2020-12-19 ENCOUNTER — Encounter: Admit: 2020-12-19 | Discharge: 2020-12-20 | Payer: PRIVATE HEALTH INSURANCE

## 2020-12-19 DIAGNOSIS — L639 Alopecia areata, unspecified: Principal | ICD-10-CM

## 2020-12-19 MED ORDER — CLOBETASOL 0.05 % SCALP SOLUTION
OPHTHALMIC | 4 refills | 0.00000 days | Status: CP
Start: 2020-12-19 — End: ?

## 2021-01-11 DIAGNOSIS — Z1231 Encounter for screening mammogram for malignant neoplasm of breast: Principal | ICD-10-CM

## 2021-01-11 MED ORDER — RIZATRIPTAN 10 MG TABLET
ORAL_TABLET | Freq: Once | ORAL | 2 refills | 0 days | Status: CP | PRN
Start: 2021-01-11 — End: ?

## 2021-01-14 ENCOUNTER — Ambulatory Visit: Payer: Self-pay

## 2021-01-17 DIAGNOSIS — Z1211 Encounter for screening for malignant neoplasm of colon: Principal | ICD-10-CM

## 2021-01-21 ENCOUNTER — Encounter: Admit: 2021-01-21 | Discharge: 2021-01-22 | Payer: PRIVATE HEALTH INSURANCE

## 2021-01-21 MED ORDER — ESCITALOPRAM 10 MG TABLET
ORAL_TABLET | Freq: Every day | ORAL | 0 refills | 30.00000 days | Status: CP
Start: 2021-01-21 — End: 2021-01-21

## 2021-01-22 ENCOUNTER — Encounter: Admit: 2021-01-22 | Discharge: 2021-01-23 | Payer: PRIVATE HEALTH INSURANCE

## 2021-01-22 DIAGNOSIS — L639 Alopecia areata, unspecified: Principal | ICD-10-CM

## 2021-01-22 DIAGNOSIS — R21 Rash and other nonspecific skin eruption: Principal | ICD-10-CM

## 2021-01-22 MED ORDER — CLOBETASOL 0.05 % SCALP SOLUTION
OPHTHALMIC | 4 refills | 0.00000 days | Status: CP
Start: 2021-01-22 — End: ?

## 2021-01-22 MED ORDER — CLOBETASOL 0.05 % TOPICAL OINTMENT
OPHTHALMIC | 5 refills | 0.00000 days | Status: CP
Start: 2021-01-22 — End: ?

## 2021-01-31 ENCOUNTER — Encounter
Admit: 2021-01-31 | Discharge: 2021-02-01 | Payer: PRIVATE HEALTH INSURANCE | Attending: Internal Medicine | Primary: Internal Medicine

## 2021-01-31 DIAGNOSIS — Z131 Encounter for screening for diabetes mellitus: Principal | ICD-10-CM

## 2021-01-31 DIAGNOSIS — Z124 Encounter for screening for malignant neoplasm of cervix: Principal | ICD-10-CM

## 2021-01-31 DIAGNOSIS — N951 Menopausal and female climacteric states: Principal | ICD-10-CM

## 2021-01-31 DIAGNOSIS — L639 Alopecia areata, unspecified: Principal | ICD-10-CM

## 2021-01-31 DIAGNOSIS — Z136 Encounter for screening for cardiovascular disorders: Principal | ICD-10-CM

## 2021-01-31 DIAGNOSIS — Z1159 Encounter for screening for other viral diseases: Principal | ICD-10-CM

## 2021-01-31 MED ORDER — ESCITALOPRAM 10 MG TABLET
ORAL_TABLET | Freq: Every day | ORAL | 4 refills | 90 days | Status: CP
Start: 2021-01-31 — End: ?

## 2021-01-31 MED ORDER — RIZATRIPTAN 10 MG TABLET
ORAL_TABLET | Freq: Once | ORAL | 5 refills | 0 days | Status: CP | PRN
Start: 2021-01-31 — End: ?

## 2021-02-08 DIAGNOSIS — L639 Alopecia areata, unspecified: Principal | ICD-10-CM

## 2021-02-08 MED ORDER — PREDNISONE 10 MG TABLET
ORAL_TABLET | ORAL | 0 refills | 0.00000 days | Status: CP
Start: 2021-02-08 — End: ?

## 2021-02-26 ENCOUNTER — Encounter: Admit: 2021-02-26 | Discharge: 2021-02-27 | Payer: PRIVATE HEALTH INSURANCE

## 2021-02-26 DIAGNOSIS — L639 Alopecia areata, unspecified: Principal | ICD-10-CM

## 2021-02-26 DIAGNOSIS — Z79899 Other long term (current) drug therapy: Principal | ICD-10-CM

## 2021-02-28 ENCOUNTER — Encounter: Admit: 2021-02-28 | Discharge: 2021-03-01 | Payer: PRIVATE HEALTH INSURANCE

## 2021-03-01 DIAGNOSIS — L639 Alopecia areata, unspecified: Principal | ICD-10-CM

## 2021-03-01 MED ORDER — FOLIC ACID 1 MG TABLET
ORAL_TABLET | 11 refills | 0.00000 days | Status: CP
Start: 2021-03-01 — End: ?

## 2021-03-01 MED ORDER — METHOTREXATE SODIUM 2.5 MG TABLET
ORAL_TABLET | 1 refills | 0.00000 days | Status: CP
Start: 2021-03-01 — End: ?

## 2021-03-18 ENCOUNTER — Other Ambulatory Visit: Payer: Self-pay

## 2021-03-18 ENCOUNTER — Encounter: Payer: Self-pay | Admitting: Emergency Medicine

## 2021-03-18 ENCOUNTER — Ambulatory Visit
Admission: EM | Admit: 2021-03-18 | Discharge: 2021-03-18 | Disposition: A | Discharge status: Home / Self Care | Payer: BLUE CROSS/BLUE SHIELD | Attending: Physician Assistant | Admitting: Physician Assistant

## 2021-03-18 DIAGNOSIS — R350 Frequency of micturition: Secondary | ICD-10-CM

## 2021-03-18 DIAGNOSIS — R739 Hyperglycemia, unspecified: Secondary | ICD-10-CM | POA: Diagnosis not present

## 2021-03-18 DIAGNOSIS — R102 Pelvic and perineal pain: Secondary | ICD-10-CM

## 2021-03-18 LAB — POCT URINALYSIS DIP (DEVICE)
Bilirubin Urine: NEGATIVE
Glucose, UA: 500 mg/dL — AB
Hgb urine dipstick: NEGATIVE
Ketones, ur: NEGATIVE mg/dL
Leukocytes,Ua: NEGATIVE
Nitrite: NEGATIVE
Protein, ur: NEGATIVE mg/dL
Specific Gravity, Urine: >=1.03 (ref 1.005–1.030)
Urobilinogen, UA: 0.2 mg/dL (ref 0.0–1.0)
pH: 5 (ref 5.0–8.0)

## 2021-03-18 LAB — GLUCOSE, CAPILLARY: Glucose-Capillary: 147 mg/dL — ABNORMAL HIGH (ref 70–99)

## 2021-03-18 NOTE — ED Provider Notes (Signed)
[facility]    CSN: [id number] Arrival date & time: 03/18/21  1916      History   Chief Complaint Chief Complaint  Patient presents with  . Dysuria  . Urinary Frequency    HPI Claudia Harper is a 55 y.o. female who presents with onset of bladder pressure and frequency that started 3h ago. Denies dysuria.  Has sweet cranberry juice at 5 pm  And feels better. States she has been on prednisone for alopecia x 1 month. She has hx of prediabetes.    Past Medical History:  Diagnosis Date  . Anxiety   . Depression   . Eczema   . Injury of foot including toes 02/2014   fracture of left fourth digit  . Migraines   . Right foot injury    fx of foot, tore tendon, and sprained right ankle 2014;    Patient Active Problem List   Diagnosis Date Noted  . Nonspecific syndrome suggestive of viral illness 11/08/2020  . Acute URI 11/08/2020  . Migraine headache without aura 01/13/2017  . Eczema 01/13/2017  . Anxiety 01/13/2017  . Colon polyps 01/13/2017    Past Surgical History:  Procedure Laterality Date  . CHOLECYSTECTOMY  2002  . COLONOSCOPY  2015   polyps found, next one in 5 years  . KIDNEY STONE SURGERY  1985   done with cystoscopy  . LIPOMA EXCISION  2007   right inner thigh    OB History    Gravida  2   Para  2   Term  2   Preterm      AB      Living  2     SAB      IAB      Ectopic      Multiple      Live Births  2        Obstetric Comments  Patient hospitalized at 34 weeks with G1 for viral illness.         Home Medications    Prior to Admission medications   Medication Sig Start Date End Date Taking? Authorizing Provider  escitalopram (LEXAPRO) 10 MG tablet  11/05/16  Yes [provider]  folic acid (FOLVITE) 1 MG tablet Take by mouth. 03/02/21  Yes [provider]  methotrexate (RHEUMATREX) 2.5 MG tablet Take 6 tablets by mouth once WEEKLY. 03/01/21  Yes [provider]  rizatriptan (MAXALT) 10 MG  tablet TAKE 1 TABLET BY MOUTH AS NEEDED MIGRAINE MAY REPEAT IN 2 HRS IF NEEDED 10/18/20  Yes [provider]  albuterol (VENTOLIN HFA) 108 (90 Base) MCG/ACT inhaler Inhale 2 puffs into the lungs every 6 (six) hours as needed for wheezing or shortness of breath. 11/12/20 03/18/21 Yes AmyotAli Lowe, NP  SUMAtriptan (IMITREX) 50 MG tablet  01/06/17 11/12/20  [provider]    Family History Family History  Problem Relation Age of Onset  . Dementia Mother   . Hypertension Mother   . Diabetes Mother   . Diabetes Father   . Hypertension Father   . Heart block Father   . Colon cancer Maternal Grandmother 72  . Heart failure Maternal Grandmother   . Hypertension Maternal Grandmother   . Brain cancer Maternal Grandfather 70  . Hypertension Paternal Grandmother   . Leukemia Paternal Grandmother 99  . Breast cancer Neg Hx     Social History Social History   Tobacco Use  . Smoking status: Former Smoker  Years: 15.00    Types: Cigarettes    Start date: 55    Quit date: 2009    Years since quitting: 13.4  . Smokeless tobacco: Never Used  Vaping Use  . Vaping Use: Never used  Substance Use Topics  . Alcohol use: Yes    Alcohol/week: 0.0 standard drinks    Comment: 3 cocktails/week  . Drug use: No     Allergies   Shellfish allergy and Sulfa antibiotics   Review of Systems Review of Systems  Gastrointestinal: Positive for abdominal pain.  Endocrine: Positive for polydipsia.  Genitourinary: Positive for dysuria and frequency. Negative for urgency.     Physical Exam Triage Vital Signs ED Triage Vitals  Enc Vitals Group     BP 03/18/21 1935 128/86     Pulse Rate 03/18/21 1935 92     Resp 03/18/21 1935 18     Temp 03/18/21 1935 98.8 F (37.1 C)     Temp Source 03/18/21 1935 Oral     SpO2 03/18/21 1935 99 %     Weight 03/18/21 1932 165 lb (74.8 kg)     Height 03/18/21 1932 5\' 1"  (1.549 m)     Head Circumference --      Peak Flow --      Pain Score  03/18/21 1931 0     Pain Loc --      Pain Edu? --      Excl. in GC? --    No data found.  Updated Vital Signs BP 128/86 (BP Location: Left Arm)   Pulse 92   Temp 98.8 F (37.1 C) (Oral)   Resp 18   Ht 5\' 1"  (1.549 m)   Wt 165 lb (74.8 kg)   SpO2 99%   BMI 31.18 kg/m   Visual Acuity Right Eye Distance:   Left Eye Distance:   Bilateral Distance:    Right Eye Near:   Left Eye Near:    Bilateral Near:     Physical Exam Physical Exam Vitals and nursing note reviewed.  Constitutional:      General: She is not in acute distress.    Appearance: She is not toxic-appearing.  HENT:     Head: Normocephalic.     Right Ear: External ear normal.     Left Ear: External ear normal.  Eyes:     General: No scleral icterus.    Conjunctiva/sclera: Conjunctivae normal.  Pulmonary:     Effort: Pulmonary effort is normal.  Abdominal:     General: Bowel sounds are normal.     Palpations: Abdomen is soft. There is no mass.     Tenderness: There is no tenderness, guarding or rebound.     Comments: +/- CVA tenderness   Musculoskeletal:        General: Normal range of motion.     Cervical back: Neck supple.      Skin:    General: Skin is warm and dry.     Findings: No rash.  Neurological:     Mental Status: She is alert and oriented to person, place, and time.     Gait: Gait normal.  Psychiatric:        Mood and Affect: Mood normal.        Behavior: Behavior normal.        Thought Content: Thought content normal.        Judgment: Judgment normal.   UC Treatments / Results  Labs (all labs ordered are listed, but only  abnormal results are displayed) Labs Reviewed  GLUCOSE, CAPILLARY - Abnormal; Notable for the following components:      Result Value   Glucose-Capillary 147 (*)    All other components within normal limits  POCT URINALYSIS DIP (DEVICE) - Abnormal; Notable for the following components:   Glucose, UA 500 (*)    All other components within normal limits  POCT  URINALYSIS DIPSTICK, ED / UC  CBG MONITORING, ED    EKG   Radiology No results found.  Procedures Procedures (including critical care time)  Medications Ordered in UC Medications - No data to display  Initial Impression / Assessment and Plan / UC Course  I have reviewed the triage vital signs and the nursing notes. Pertinent labs results that were available during my care of the patient were reviewed by me and considered in my medical decision making (see chart for details). I explained to her that her UA is neg for infection. Needs to avoid sweets and too much carbs.   Final Clinical Impressions(s) / UC Diagnoses   Final diagnoses:  Hyperglycemia  Urinary frequency     Discharge Instructions     More likely you are spilling sugar in your urine and your symptoms are from the prednisone.  You need to be very strict about avoiding sweets and breads in your diet.     ED Prescriptions    None     PDMP not reviewed this encounter.   Garey Ham, [place] 03/18/21 2052

## 2021-03-18 NOTE — ED Triage Notes (Signed)
Patient c/o bladder pressure and urinary frequency that started 3 hours ago.

## 2021-03-18 NOTE — Discharge Instructions (Addendum)
More likely you are spilling sugar in your urine and your symptoms are from the prednisone.  You need to be very strict about avoiding sweets and breads in your diet.

## 2021-03-21 MED ORDER — ATENOLOL 50 MG TABLET
ORAL_TABLET | Freq: Every day | ORAL | 12 refills | 30 days | Status: CP
Start: 2021-03-21 — End: 2022-03-21

## 2021-04-16 ENCOUNTER — Encounter: Admit: 2021-04-16 | Discharge: 2021-04-17 | Payer: PRIVATE HEALTH INSURANCE

## 2021-04-16 DIAGNOSIS — Z79899 Other long term (current) drug therapy: Principal | ICD-10-CM

## 2021-04-16 DIAGNOSIS — L639 Alopecia areata, unspecified: Principal | ICD-10-CM

## 2021-04-16 MED ORDER — METHOTREXATE SODIUM 2.5 MG TABLET
ORAL_TABLET | 1 refills | 0.00000 days | Status: CP
Start: 2021-04-16 — End: ?

## 2021-04-16 MED ORDER — FOLIC ACID 1 MG TABLET
ORAL_TABLET | 11 refills | 0.00000 days | Status: CP
Start: 2021-04-16 — End: ?

## 2021-04-19 ENCOUNTER — Ambulatory Visit: Admit: 2021-04-19 | Discharge: 2021-04-20 | Payer: PRIVATE HEALTH INSURANCE

## 2021-05-16 ENCOUNTER — Encounter
Admit: 2021-05-16 | Discharge: 2021-05-17 | Payer: PRIVATE HEALTH INSURANCE | Attending: Dermatology | Primary: Dermatology

## 2021-05-16 DIAGNOSIS — L639 Alopecia areata, unspecified: Principal | ICD-10-CM

## 2021-05-16 MED ORDER — BARICITINIB 2 MG TABLET
ORAL_TABLET | Freq: Every day | ORAL | 3 refills | 90.00000 days | Status: CP
Start: 2021-05-16 — End: ?
  Filled 2021-07-10: qty 30, 30d supply, fill #0

## 2021-05-31 ENCOUNTER — Ambulatory Visit: Admit: 2021-05-31 | Discharge: 2021-06-01 | Payer: PRIVATE HEALTH INSURANCE

## 2021-05-31 DIAGNOSIS — J0100 Acute maxillary sinusitis, unspecified: Principal | ICD-10-CM

## 2021-05-31 MED ORDER — FLUCONAZOLE 150 MG TABLET
ORAL_TABLET | Freq: Once | ORAL | 0 refills | 1 days | Status: CP
Start: 2021-05-31 — End: 2021-05-31

## 2021-05-31 MED ORDER — AMOXICILLIN 875 MG-POTASSIUM CLAVULANATE 125 MG TABLET
ORAL_TABLET | Freq: Two times a day (BID) | ORAL | 0 refills | 10 days | Status: CP
Start: 2021-05-31 — End: 2021-06-10

## 2021-06-04 ENCOUNTER — Ambulatory Visit
Admit: 2021-06-04 | Discharge: 2021-06-05 | Payer: PRIVATE HEALTH INSURANCE | Attending: "Women's Health Care | Primary: "Women's Health Care

## 2021-06-04 DIAGNOSIS — N898 Other specified noninflammatory disorders of vagina: Principal | ICD-10-CM

## 2021-06-04 DIAGNOSIS — N951 Menopausal and female climacteric states: Principal | ICD-10-CM

## 2021-06-04 DIAGNOSIS — R6882 Decreased libido: Principal | ICD-10-CM

## 2021-06-04 MED ORDER — PROGESTERONE MICRONIZED 100 MG CAPSULE
ORAL_CAPSULE | 3 refills | 0.00000 days | Status: CP
Start: 2021-06-04 — End: ?

## 2021-06-06 ENCOUNTER — Ambulatory Visit
Admit: 2021-06-06 | Discharge: 2021-06-07 | Payer: PRIVATE HEALTH INSURANCE | Attending: Pain Medicine | Primary: Pain Medicine

## 2021-06-06 DIAGNOSIS — G43009 Migraine without aura, not intractable, without status migrainosus: Principal | ICD-10-CM

## 2021-06-06 DIAGNOSIS — G4440 Drug-induced headache, not elsewhere classified, not intractable: Principal | ICD-10-CM

## 2021-06-06 DIAGNOSIS — M7918 Myalgia, other site: Principal | ICD-10-CM

## 2021-06-06 DIAGNOSIS — G44229 Chronic tension-type headache, not intractable: Principal | ICD-10-CM

## 2021-06-06 MED ORDER — TOPIRAMATE 25 MG TABLET
ORAL_TABLET | 6 refills | 0.00000 days | Status: CP
Start: 2021-06-06 — End: ?

## 2021-06-06 MED ORDER — TIZANIDINE 4 MG TABLET
ORAL_TABLET | 6 refills | 0 days | Status: CP
Start: 2021-06-06 — End: ?

## 2021-06-06 MED ORDER — UBRELVY 100 MG TABLET
ORAL_TABLET | ORAL | 6 refills | 0.00000 days | Status: CP
Start: 2021-06-06 — End: ?

## 2021-06-06 MED ORDER — METHYLPREDNISOLONE 4 MG TABLETS IN A DOSE PACK
ORAL_TABLET | Freq: Every day | ORAL | 0 refills | 1 days | Status: CP
Start: 2021-06-06 — End: 2022-06-06

## 2021-06-07 DIAGNOSIS — G43001 Migraine without aura, not intractable, with status migrainosus: Principal | ICD-10-CM

## 2021-06-10 DIAGNOSIS — G43001 Migraine without aura, not intractable, with status migrainosus: Principal | ICD-10-CM

## 2021-06-10 MED ORDER — PAXLOVID 300 MG (150 MG X 2)-100 MG TABLETS IN A DOSE PACK (EUA)
ORAL_TABLET | 0 refills | 0 days | Status: CP
Start: 2021-06-10 — End: ?

## 2021-06-26 DIAGNOSIS — L639 Alopecia areata, unspecified: Principal | ICD-10-CM

## 2021-06-26 MED ORDER — METHOTREXATE SODIUM 2.5 MG TABLET
ORAL_TABLET | 1 refills | 0.00000 days | Status: CP
Start: 2021-06-26 — End: ?

## 2021-07-08 NOTE — Unmapped (Signed)
RN called back patient who said she started Topamax on Sept. 5th and has had gradually worsening symptoms like diarrhea, fatigue, and arm and leg weakness. She reports she is currently taking 50 mg in the AM and 75 mg in the PM. She said it helped her migraine, but due to her symptoms, she now wants to wean off of it and would like to know how to do it. RN said she will ask Dr. Vonna Kotyk for advice. Patient voiced understanding.

## 2021-07-08 NOTE — Unmapped (Signed)
[facility] Specialty Medication Onboarding    Specialty Medication: Olumiant 2mg  tabs  Prior Authorization: Approved   Financial Assistance: see note below  Final Copay/Day Supply: $2586.10 / 30 days    Insurance Restrictions: Yes - max 1 month supply     Notes to Pharmacist: Each Olumiant claim will need to be tested 48 hours in advance of shipping date until pt has reached her deductible.Please notify triage when this is scheduled AT LEAST 48 hours before fill date. Triage will call Olumiant to escalate copay amount to copay card company- they will put in an escalation request. Triage will call back 48 hours later to obtain Prior auth code for copay card. Once that code is entered a test claim will be done to ensure copay card pays.     The triage team has completed the benefits investigation and has determined that the patient is able to fill this medication at [facility]. Please contact the patient to complete the onboarding or follow up with the prescribing physician as needed.

## 2021-07-09 NOTE — Unmapped (Signed)
RN called back patient to relay message from Dr. Vonna Kotyk about weaning off the Topamax. Patient says that her Topamax-related symptoms have resolved today.    Patient said she only took 1 tablet of tizanidine at night and felt like she was drunk, slurring her words. She is asking if she can do 1/2 a tablet indefinitely.    Patient says like she has not any severe migraines since she last saw Dr. Vonna Kotyk. She has taken 5 Ubrelvy since last visit, but it does not seem to take away the migraine completely, pain lingers for 1-2 hours. She reports the Maxalt completely removed her migraine. Patient asks if Dr. Vonna Kotyk will consider putting her back on Maxalt if she takes it more carefully.    Per Dr. Vonna Kotyk:   Please let her know that she can stop one 25mg  pill every 2-3 days as she weans off the medication.

## 2021-07-10 NOTE — Unmapped (Addendum)
Update: Olumiant card didn't cover as much as expected - I confirmed that $56.44 would be OK for her first fill. I reviewed that her next copay would likely be lower. MAS       [facility]   Patient Onboarding/Medication Counseling    Bonnie Harvey is a 55 y.o. 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  I am speaking to the patient.    Was a Nurse, learning disability used for this call? No    Verified patient's date of birth / HIPAA.    Specialty medication(s) to be sent: Inflammatory Disorders: Olumiant      Non-specialty medications/supplies to be sent: n/a      Medications not needed at this time: n/a         Olumiant (baricitinib)    Medication & Administration     Dosage: Alopecia areata: Take 1 tablet (2mg ) by mouth once daily    Lab tests required prior to treatment initiation:  ??? Tuberculosis: Tuberculosis screening resulted in a non-reactive Quantiferon TB Gold assay.  ??? Hepatitis B: Hepatitis B serology studies are complete and non-reactive.  ??? Absolute lymphocyte count: ALC less than 500/mm3 but medication prescriber has indicated they are aware and wishing to initiate treatment at this time.  ??? Absolute neutrophil count: ANC greater than 1,000/mm3.  ??? Hemoglobin: Hemoglobin is greater than 8 g/dL.  ??? Liver function tests: Baseline ALT and AST were evaluated and are documented in the patient chart prior to treatment initiation.  ??? Pregnancy: n/a - patient is menopausal.      Administration:  Take tablet by mouth with or without food.    Adherence/Missed dose instructions:  If you forget your dose, take it as soon as you remember if it is within 10-12 hours of your normal dosing time.  If it's been longer than 12 hours from you normal dosing time skip the dose and take your next regularly scheduled dose.  Never take 2 doses at the same time to ???catch up.???    Goals of Therapy     ??? Achieve symptom remission  ??? Slow disease progression  ??? Maintenance of effective psychosocial functioning      Side Effects & Monitoring Parameters     ??? Signs of a common cold - minor sore throat, runny or stuffy nose, etc.  ??? Upset stomach  ??? Recurrence of cold sores (herpes simplex)    The following side effects should be reported to the provider:  ??? Signs of a hypersensitivity reaction - rash; hives; itching; red, swollen, blistered, or peeling skin; wheezing; tightness in the chest or throat; difficulty breathing, swallowing, or talking; swelling of the mouth, face, lips, tongue, or throat; etc.  ??? Reduced immune function - report signs of infection such as fever; chills; body aches; very bad sore throat; ear or sinus pain; cough; more sputum or change in color of sputum; pain with passing urine; wound that will not heal, etc.  Also at a slightly higher risk of some malignancies (mainly skin and blood cancers) due to this reduced immune function.  o In the case of signs of infection - the patient should hold the next dose of Olumiant?? and call your primary care provider to ensure adequate medical care.  Treatment may be resumed when infection is treated and patient is asymptomatic.  ??? Changes in skin - a new growth or lump that forms; changes in shape, size, or color of a previous mole or  marking  ??? Signs of thrombosis - difficulty breathing, swelling of lower extremity, skin that is red and warm to the touch, etc.   ??? Swollen gland, night sweats, shortness of breath, or weight loss without trying  ??? Swelling or pain in your stomach that is very bad, gets worse, or does not go away  ??? Signs of unexplained bruising or bleeding - throwing up blood or emesis that looks like coffee grounds; black, tarry, or bloody stool; etc.      Contraindications, Warnings, & Precautions     ??? Have your bloodwork checked as you have been told by your prescriber  ??? Talk with your doctor if you are pregnant, planning to become pregnant, or breastfeeding  ??? Discuss the possible need for holding your dose(s) of Olumiant?? when a planned procedure is scheduled with the prescriber as it may delay healing/recovery timeline       Drug/Food Interactions     ??? Medication list reviewed in Epic. The patient was instructed to inform the care team before taking any new medications or supplements. Drug interaction identified between methotrexate and Olumiant - monitor for signs of infection. Patient will stop taking methotrexate once she starts Olumiant.  ??? Talk with you prescriber or pharmacist before receiving any live vaccinations while taking this medication and after you stop taking it    Storage, Handling Precautions, & Disposal     ??? Store this medication at room temperature  ??? Protect from moisture      Current Medications (including OTC/herbals), Comorbidities and Allergies     Current Outpatient Medications   Medication Sig Dispense Refill   ??? albuterol HFA 90 mcg/actuation inhaler Inhale 2 puffs.     ??? atenoloL (TENORMIN) 50 MG tablet Take 1 tablet (50 mg total) by mouth in the morning. 30 tablet 12   ??? baricitinib (OLUMIANT) 2 mg Tab tablet Take 1 tablet (2 mg total) by mouth daily. 90 tablet 3   ??? clobetasoL (TEMOVATE) 0.05 % external solution Apply topically to scalp twice daily as needed. 50 mL 4   ??? clobetasoL (TEMOVATE) 0.05 % ointment Apply to affected areas around nails twice daily x 2 weeks (Patient not taking: Reported on 06/06/2021) 60 g 1   ??? clobetasoL (TEMOVATE) 0.05 % ointment Apply topically twice daily as needed only for rash/eczema. Trial twice daily as well to areas of alopecia areata on scalp. 60 g 5   ??? codeine-guaifenesin (GUAIFENESIN AC) 10-100 mg/5 mL liquid Take 10 mL by mouth.     ??? crisaborole 2 % Oint AAA bid for eczema (Patient not taking: Reported on 06/06/2021) 60 g 3   ??? escitalopram oxalate (LEXAPRO) 10 MG tablet Take 1 tablet (10 mg total) by mouth daily. 90 tablet 4   ??? fluticasone (FLONASE) 50 mcg/actuation nasal spray 2 sprays by Each Nare route daily. 16 g 11   ??? folic acid (FOLVITE) 1 MG tablet Take 3 tablets on days that you do not take methotrexate. 100 tablet 11   ??? levocetirizine (XYZAL) 5 MG tablet Take 5 mg by mouth every evening.     ??? meloxicam (MOBIC) 15 MG tablet Continuous. (Patient not taking: Reported on 06/06/2021)     ??? methotrexate 2.5 MG tablet TAKE 6 TABLETS BY MOUTH ONCE WEEKLY 24 tablet 1   ??? methylPREDNISolone (MEDROL DOSEPACK) 4 mg tablet Take 1 tablet (4 mg total) by mouth daily. follow package directions 1 tablet 0   ??? multivitamin (THERAGRAN) per tablet Take 1 tablet  by mouth.     ??? naproxen (NAPROSYN) 500 MG tablet 1 tab As needed for onset of migraine 20 tablet 11   ??? PAXLOVID CO-PACK, EUA, (PAXLOVID CO-PACK, EUA,) 300 mg (150 mg x 2)-100 mg tablet See package instructions. 30 tablet 0   ??? progesterone (PROMETRIUM) 100 MG capsule Take one cap po at bedtime, stopping for 5 days with menstrual periods. 90 capsule 3   ??? tiZANidine (ZANAFLEX) 4 MG tablet Start with 1 PO at bedtime and slowly increase to 3 PO at bedtime. 90 tablet 6   ??? topiramate (TOPAMAX) 25 MG tablet Wean up to taking 2 PO in the morning and 3 PO at night. 150 tablet 6   ??? traMADoL (ULTRAM) 50 mg tablet every six (6) hours.     ??? ubrogepant (UBRELVY) 100 mg tablet Take one at the onset of a migraine, can repeat in 2 hours if needed. Only treat 8 migraines per month. 10 tablet 6     No current facility-administered medications for this visit.       Allergies   Allergen Reactions   ??? Shellfish Containing Products Nausea And Vomiting   ??? Sulfa (Sulfonamide Antibiotics) Rash       Patient Active Problem List   Diagnosis   ??? Generalized anxiety disorder   ??? Eczema   ??? Migraine without aura and with status migrainosus, not intractable   ??? Pre-diabetes       Reviewed and up to date in Epic.    Appropriateness of Therapy     Acute infections noted within Epic:  No active infections  Patient reported infection: None    Is medication and dose appropriate based on diagnosis and infection status? Yes    Prescription has been clinically reviewed: Yes      Baseline Quality of Life Assessment      How many days over the past month did your alopecia areata  keep you from your normal activities? For example, brushing your teeth or getting up in the morning. Patient declined to answer    Financial Information     Medication Assistance provided: Prior Authorization and Copay Assistance    Anticipated copay of $5 reviewed with patient. Verified delivery address.    Delivery Information     Scheduled delivery date: 9/29    Expected start date: 9/30    Medication will be delivered via UPS to the prescription address in [facility].  This shipment will not require a signature.      Explained the services we provide at [facility] and that each month we would call to set up refills.  Stressed importance of returning phone calls so that we could ensure they receive their medications in time each month.  Informed patient that we should be setting up refills 7-10 days prior to when they will run out of medication.  A pharmacist will reach out to perform a clinical assessment periodically.  Informed patient that a welcome packet, containing information about our pharmacy and other support services, a Notice of Privacy Practices, and a drug information handout will be sent.      The patient or caregiver noted above participated in the development of this care plan and knows that they can request review of or adjustments to the care plan at any time.      Patient or caregiver verbalized understanding of the above information as well as how to contact the pharmacy at [phone] option [phone] with any questions/concerns.  The pharmacy is open Monday through Friday 8:30am-4:30pm.  A pharmacist is available 24/7 via pager to answer any clinical questions they may have.    Patient Specific Needs     - Does the patient have any physical, cognitive, or cultural barriers? No    - Does the patient have adequate living arrangements? (i.e. the ability to store and take their medication appropriately) Yes    - Did you identify any home environmental safety or security hazards? No    - Patient prefers to have medications discussed with  Patient     - Is the patient or caregiver able to read and understand education materials at a high school level or above? Yes    - Patient's primary language is  English     - Is the patient high risk? No    - Does the patient require physician intervention or other additional services (i.e. dietary/nutrition, smoking cessation, social work)? No      Clydell Hakim  [facility] Specialty Pharmacist

## 2021-07-10 NOTE — Unmapped (Signed)
RN tried to call patient to relay message from Dr. Vonna Kotyk below. RN left callback message with no PHI.     Per Dr. Vonna Kotyk, 2 mg of tizanidine will not help. He recommends slowly weaning up on tizanidine by 2 mg every 3-4 weeks.

## 2021-07-11 NOTE — Unmapped (Signed)
RN left callback message with no PHI.

## 2021-07-11 NOTE — Unmapped (Signed)
RN called back patient to relay Dr. Katherina Mires message to slowly wean up on tizanidine by 2 mg every 3-4 weeks. Patient voiced understanding, said she would try weaning up on tizanidine.     Patient reported she would stay on Topamax even though she still has diarrhea and would live with the Ubrelvy.

## 2021-07-23 ENCOUNTER — Ambulatory Visit
Admit: 2021-07-23 | Discharge: 2021-07-24 | Payer: PRIVATE HEALTH INSURANCE | Attending: "Women's Health Care | Primary: "Women's Health Care

## 2021-07-23 DIAGNOSIS — N951 Menopausal and female climacteric states: Principal | ICD-10-CM

## 2021-07-23 DIAGNOSIS — Z01419 Encounter for gynecological examination (general) (routine) without abnormal findings: Principal | ICD-10-CM

## 2021-07-23 NOTE — Unmapped (Signed)
Hormone Therapy (HT): Care Instructions  Your Care Instructions    Hormone therapy (HT) is medicine to treat symptoms of menopause, such as hot flashes, vaginal dryness, and sleep problems. It replaces the hormones that drop at menopause. Most women get relief from these symptoms within weeks of starting HT.  HT contains two female hormones, estrogen and progestin. HT may come in the form of a pill, patch, gel, spray, or vaginal ring. A vaginal cream or a vaginal ring that has a much lower dose of estrogen may be used to relieve vaginal dryness only.  HT has some risks. Most doctors recommend that women only take HT for as short a time as possible. This is to reduce the chances of heart disease, breast cancer, blood clots, and stroke that may be connected to HT. Be sure to have regular checkups with your doctor when taking HT.  Talk with your doctor about whether HT is right for you. If you decide that the benefits of HT outweigh the risks, ask your doctor to prescribe the lowest effective dose for as short a time as possible.  Follow-up care is a key part of your treatment and safety. Be sure to make and go to all appointments, and call your doctor if you are having problems. It's also a good idea to know your test results and keep a list of the medicines you take.  Why might you take HT?  HT reduces symptoms of menopause. These include hot flashes, mood swings, and sleep problems.  The estrogen in HT helps to prevent thinning bones. And it may lower the chance of colon cancer.  HT helps keep the lining of the vagina moist and thick. This can reduce irritation.  HT helps protect against dental problems, such as tooth loss and gum disease.  What are the risks of taking HT?  Some women who take HT may have vaginal bleeding, bloating, nausea, sore breasts, mood swings, and headaches. Talk to your doctor about changing the type of HT you take or lowering the dose. This may help to end these side effects.  Taking HT may slightly increase your risk for heart disease, breast cancer, ovarian cancer, blood clots, and stroke.  You should not take HT if you:  Could be pregnant.  Have a personal history of breast cancer, endometrial cancer, pulmonary embolism, deep vein thrombosis, heart attack, or stroke.  Have vaginal bleeding from an unknown cause.  Have active liver disease.  What can you do to reduce the symptoms of menopause?  Eat healthy foods and get regular exercise. This also will help to maintain strong bones and a healthy heart.  Do not smoke. If you smoke, you can reduce hot flashes and long-term health risks by stopping. If you need help quitting, talk to your doctor about stop-smoking programs and medicines. These can increase your chances of quitting for good.  Practice daily breathing exercises (meditation) to reduce hot flashes and mood swings.  Limit the amount of alcohol you drink. This can reduce symptoms of menopause and long-term health risks.  Keep your home and office cool.  Use a vaginal lubricant, such as Astroglide, Wet Gel Lubricant, or K-Y Jelly.  Do pelvic floor (Kegel) exercises, which tighten and strengthen pelvic muscles. To do Kegel exercises:  Squeeze the same muscles you would use to stop your urine. Your belly and thighs should not move.  Hold the squeeze for 3 seconds, then relax for 3 seconds.  Start with 3 seconds. Then add  1 second each week until you are able to squeeze for 10 seconds.  Repeat the exercise 10 to 15 times a session. Do three or more sessions a day.  Where can you learn more?  Go to [facility] at https://myuncchart.org  [facility] [employer]. Enter V552 in the search box to learn more about Hormone Therapy (HT): Care Instructions.  Current as of: December 01, 2017  Content Version: 12.3  ?? 2006-2019 [employer]. Care instructions adapted under license by [facility]. If you have questions about a medical condition or this instruction, always ask your healthcare professional. [employer], Inc[employer] disclaims any warranty or liability for your use of this information.

## 2021-07-23 NOTE — Unmapped (Signed)
ASSESSMENT & PLAN:    Bonnie Harvey is a 55 y.o. [id number] who is here for :      Diagnosis ICD-10-CM Associated Orders   1. Well woman exam   Breast and pelvic exam WNL    Monthly SBE reviewed   Encouraged regular exercise and healthy diet   Recommend employing stress mgt  Recommend regular hours for sleep   Reviewed:  Alcohol use: minimal  Smoking: not a smoker  Drug use: no drug use   Encourage avoiding excessive sun exposure.  Recommend seeing dermatologist for screenings.    Pap smear will be due: 2027 if neg/neg today.    Sees PCP for other routine screening.     Z01.419 Mammo Digital Screening Bilateral     Pap Smear   2. Perimenopause     Continue with nightly 100 mg progesterone.  Stop for 5 nights if she does have a period.    Follow up in 6 mo, at that point, consider adding estradiol patch.      N95.1           Return in about 6 months (around 01/21/2022).          SUBJECTIVE:    HPI: Bonnie Harvey is a 55 y.o.  G2P2002 \\here  for routine GYN exam.  Pt started nightly progesterone in August.  Her last period was 05/10/21.  Has not had any further bleeding.  Hot flashes improved, less, but not gone.  Reports vaginal dryness is not a problem.  Pt denies any specific gynecologic complaints. Denies breast lump/pain or nipple d/c. Denies pelvic pain. Denies abnl vaginal d/c, itch or odor. Denies any vulvar lesions or irritation.  Pt is married, sexually active.  Has hx of migraine, no aura.  She tried Topomax, had significant negative side effects.  Reports that since starting progesterone and not having periods, has not had significant migraines, had a few headaches, easily manageable.  Seeing neurology.  Also sees dermatology- hx of alopecia.   Last pap 07/2015  Mammogram 01/2021    OB History   Gravida Para Term Preterm AB Living   2 2 2     2    SAB IAB Ectopic Molar Multiple Live Births             2      # Outcome Date GA Lbr Len/2nd Weight Sex Delivery Anes PTL Lv   2 Term 1994 [redacted]w[redacted]d   M    LIV   1 Term 1989 [redacted]w[redacted]d   F    LIV       Past Medical History:   Diagnosis Date   ??? Alopecia    ??? Anxiety    ??? Depression    ??? Eczema    ??? Migraine        Past Surgical History:   Procedure Laterality Date   ??? CHOLECYSTECTOMY     ??? SKIN BIOPSY         Family History   Problem Relation Age of Onset   ??? Diabetes Mother    ??? Dementia Mother    ??? Skin cancer Mother    ??? Melanoma Mother    ??? Alzheimer's disease Mother    ??? Hyperlipidemia Father    ??? Colon cancer Maternal Grandmother    ??? Leukemia Paternal Grandmother    ??? Testicular cancer Son    ??? Basal cell carcinoma Neg Hx    ??? Squamous cell carcinoma Neg Hx  Social History     Socioeconomic History   ??? Marital status: Married     Spouse name: None   ??? Number of children: 2   ??? Years of education: None   ??? Highest education level: None   Tobacco Use   ??? Smoking status: Former Smoker     Quit date: 2014     Years since quitting: 8.7   ??? Smokeless tobacco: Never Used   ??? Tobacco comment: Vaped nicotine until 2019   Vaping Use   ??? Vaping Use: Former   Substance and Sexual Activity   ??? Alcohol use: Yes     Comment: rarely   ??? Drug use: No   ??? Sexual activity: Yes     Partners: Male     Birth control/protection: Vasectomy   Other Topics Concern   ??? Do you use sunscreen? Yes   ??? Tanning bed use? No   ??? Are you easily burned? No   ??? Excessive sun exposure? No   ??? Blistering sunburns? No       Current Outpatient Medications   Medication Sig Dispense Refill   ??? atenoloL (TENORMIN) 50 MG tablet Take 1 tablet (50 mg total) by mouth in the morning. 30 tablet 12   ??? baricitinib (OLUMIANT) 2 mg Tab tablet Take 1 tablet (2 mg total) by mouth daily. 90 tablet 3   ??? clobetasoL (TEMOVATE) 0.05 % external solution Apply topically to scalp twice daily as needed. 50 mL 4   ??? clobetasoL (TEMOVATE) 0.05 % ointment Apply to affected areas around nails twice daily x 2 weeks 60 g 1   ??? escitalopram oxalate (LEXAPRO) 10 MG tablet Take 1 tablet (10 mg total) by mouth daily. 90 tablet 4   ??? levocetirizine (XYZAL) 5 MG tablet Take 5 mg by mouth every evening.     ??? progesterone (PROMETRIUM) 100 MG capsule Take one cap po at bedtime, stopping for 5 days with menstrual periods. 90 capsule 3   ??? albuterol HFA 90 mcg/actuation inhaler Inhale 2 puffs. (Patient not taking: Reported on 07/23/2021)     ??? fluticasone (FLONASE) 50 mcg/actuation nasal spray 2 sprays by Each Nare route daily. 16 g 11   ??? folic acid (FOLVITE) 1 MG tablet Take 3 tablets on days that you do not take methotrexate. (Patient not taking: Reported on 07/23/2021) 100 tablet 11   ??? meloxicam (MOBIC) 15 MG tablet Continuous. (Patient not taking: No sig reported)     ??? methotrexate 2.5 MG tablet TAKE 6 TABLETS BY MOUTH ONCE WEEKLY (Patient not taking: Reported on 07/23/2021) 24 tablet 1   ??? methylPREDNISolone (MEDROL DOSEPACK) 4 mg tablet Take 1 tablet (4 mg total) by mouth daily. follow package directions (Patient not taking: No sig reported) 1 tablet 0   ??? multivitamin (THERAGRAN) per tablet Take 1 tablet by mouth. (Patient not taking: Reported on 07/23/2021)     ??? naproxen (NAPROSYN) 500 MG tablet 1 tab As needed for onset of migraine (Patient not taking: Reported on 07/23/2021) 20 tablet 11   ??? PAXLOVID CO-PACK, EUA, (PAXLOVID CO-PACK, EUA,) 300 mg (150 mg x 2)-100 mg tablet See package instructions. (Patient not taking: No sig reported) 30 tablet 0   ??? tiZANidine (ZANAFLEX) 4 MG tablet Start with 1 PO at bedtime and slowly increase to 3 PO at bedtime. (Patient not taking: No sig reported) 90 tablet 6   ??? topiramate (TOPAMAX) 25 MG tablet Wean up to taking 2 PO in the morning and  3 PO at night. (Patient not taking: Reported on 07/23/2021) 150 tablet 6   ??? traMADoL (ULTRAM) 50 mg tablet every six (6) hours. (Patient not taking: Reported on 07/23/2021)     ??? ubrogepant (UBRELVY) 100 mg tablet Take one at the onset of a migraine, can repeat in 2 hours if needed. Only treat 8 migraines per month. (Patient not taking: Reported on 07/23/2021) 10 tablet 6 No current facility-administered medications for this visit.       Allergies   Allergen Reactions   ??? Shellfish Containing Products Nausea And Vomiting   ??? Sulfa (Sulfonamide Antibiotics) Rash       Review Of Systems   ROS per HPI    OBJECTIVE:     PHYSICAL EXAM:  BP 103/72  - Pulse 63  - Wt 76.3 kg (168 lb 3.2 oz)  - LMP 05/05/2021 (Exact Date)  - BMI 31.78 kg/m??   Constitutional: Well-developed, well-nourished, female in no acute distress  Respiratory:   Normal Respiratory effort   Breast Exam: No breast masses, skin changes, nipple abnormality or discharge bilaterally.  No enlarged lymph nodes in axilla bilat.  Gastrointestinal: soft, non distended, non tender to palpation, no palpable masses  Genitourinary:       External Genitalia: Normal female genitalia, nl hair distribution, no masses or skin lesions.  Urethra: nl without masses or tenderness  Vagina: Well-rugated, no lesions, no abnormal discharge, +1 cystocele, no rectocele  Cervix: No lesions, normal size and consistency; no cervical discharge, no cervical motion tenderness   Uterus: nl size, nl contour, midline, mobile, NT, nl support  Adnexa/Parametria: No masses bilat,  no tenderness bilat  Perineum/Anus: No lesions visible  Lymphatic: no enlarged or tender lymph nodes in axillae, groin  Psychiatric: Mood and affect appropriate, Alert and oriented to person, place, and time. Nl judgement and insight

## 2021-08-02 NOTE — Unmapped (Signed)
[facility] Clinical Assessment & Refill Coordination Note    Bonnie Harvey, DOB: Oct 23, 1965  Phone: [phone] (home)     All above HIPAA information was verified with patient.     Was a Nurse, learning disability used for this call? No    Specialty Medication(s):   Inflammatory Disorders: Olumiant     Current Outpatient Medications   Medication Sig Dispense Refill   ??? albuterol HFA 90 mcg/actuation inhaler Inhale 2 puffs. (Patient not taking: Reported on 07/23/2021)     ??? atenoloL (TENORMIN) 50 MG tablet Take 1 tablet (50 mg total) by mouth in the morning. 30 tablet 12   ??? baricitinib (OLUMIANT) 2 mg Tab tablet Take 1 tablet (2 mg total) by mouth daily. 90 tablet 3   ??? clobetasoL (TEMOVATE) 0.05 % external solution Apply topically to scalp twice daily as needed. 50 mL 4   ??? clobetasoL (TEMOVATE) 0.05 % ointment Apply to affected areas around nails twice daily x 2 weeks 60 g 1   ??? escitalopram oxalate (LEXAPRO) 10 MG tablet Take 1 tablet (10 mg total) by mouth daily. 90 tablet 4   ??? fluticasone (FLONASE) 50 mcg/actuation nasal spray 2 sprays by Each Nare route daily. 16 g 11   ??? folic acid (FOLVITE) 1 MG tablet Take 3 tablets on days that you do not take methotrexate. (Patient not taking: Reported on 07/23/2021) 100 tablet 11   ??? levocetirizine (XYZAL) 5 MG tablet Take 5 mg by mouth every evening.     ??? meloxicam (MOBIC) 15 MG tablet Continuous. (Patient not taking: No sig reported)     ??? methotrexate 2.5 MG tablet TAKE 6 TABLETS BY MOUTH ONCE WEEKLY (Patient not taking: Reported on 07/23/2021) 24 tablet 1   ??? methylPREDNISolone (MEDROL DOSEPACK) 4 mg tablet Take 1 tablet (4 mg total) by mouth daily. follow package directions (Patient not taking: No sig reported) 1 tablet 0   ??? multivitamin (THERAGRAN) per tablet Take 1 tablet by mouth. (Patient not taking: Reported on 07/23/2021)     ??? naproxen (NAPROSYN) 500 MG tablet 1 tab As needed for onset of migraine (Patient not taking: Reported on 07/23/2021) 20 tablet 11   ??? PAXLOVID CO-PACK, EUA, (PAXLOVID CO-PACK, EUA,) 300 mg (150 mg x 2)-100 mg tablet See package instructions. (Patient not taking: No sig reported) 30 tablet 0   ??? progesterone (PROMETRIUM) 100 MG capsule Take one cap po at bedtime, stopping for 5 days with menstrual periods. 90 capsule 3   ??? tiZANidine (ZANAFLEX) 4 MG tablet Start with 1 PO at bedtime and slowly increase to 3 PO at bedtime. (Patient not taking: No sig reported) 90 tablet 6   ??? topiramate (TOPAMAX) 25 MG tablet Wean up to taking 2 PO in the morning and 3 PO at night. (Patient not taking: Reported on 07/23/2021) 150 tablet 6   ??? traMADoL (ULTRAM) 50 mg tablet every six (6) hours. (Patient not taking: Reported on 07/23/2021)     ??? ubrogepant (UBRELVY) 100 mg tablet Take one at the onset of a migraine, can repeat in 2 hours if needed. Only treat 8 migraines per month. (Patient not taking: Reported on 07/23/2021) 10 tablet 6     No current facility-administered medications for this visit.        Changes to medications: Bonnie Harvey reports no changes at this time.    Allergies   Allergen Reactions   ??? Shellfish Containing Products Nausea And Vomiting   ??? Sulfa (Sulfonamide Antibiotics) Rash  Changes to allergies: No    SPECIALTY MEDICATION ADHERENCE     Olumiant 2 mg: 11 days of medicine on hand       Medication Adherence    Patient reported X missed doses in the last month: 0  Specialty Medication: Olumiant 2mg   Patient is on additional specialty medications: No  Informant: patient          Specialty medication(s) dose(s) confirmed: Regimen is correct and unchanged.     Are there any concerns with adherence? No    Adherence counseling provided? Not needed    CLINICAL MANAGEMENT AND INTERVENTION      Clinical Benefit Assessment:    Do you feel the medicine is effective or helping your condition? Yes    Clinical Benefit counseling provided? Not needed    Adverse Effects Assessment:    Are you experiencing any side effects? No    Are you experiencing difficulty administering your medicine? No    Quality of Life Assessment:    Quality of Life    Rheumatology  Oncology  Dermatology  1. What impact has your specialty medication had on the symptoms of your skin condition (i.e. itchiness, soreness, stinging)?: Some  2. What impact has your specialty medication had on your comfort level with your skin?: Minimal  Cystic Fibrosis              Have you discussed this with your provider? Yes--patient has a halo wig     Acute Infection Status:    Acute infections noted within Epic:  No active infections  Patient reported infection: None    Therapy Appropriateness:    Is therapy appropriate and patient progressing towards therapeutic goals? Yes, therapy is appropriate and should be continued    DISEASE/MEDICATION-SPECIFIC INFORMATION      N/A    PATIENT SPECIFIC NEEDS     - Does the patient have any physical, cognitive, or cultural barriers? No    - Is the patient high risk? No    - Does the patient require a Care Management Plan? No     - Does the patient require physician intervention or other additional services (i.e. nutrition, smoking cessation, social work)? No      SHIPPING     Specialty Medication(s) to be Shipped:   Inflammatory Disorders: Olumiant    Other medication(s) to be shipped: No additional medications requested for fill at this time     Changes to insurance: No    Delivery Scheduled: Yes, Expected medication delivery date: 10/26.     Medication will be delivered via UPS to the confirmed prescription address in [facility].    The patient will receive a drug information handout for each medication shipped and additional FDA Medication Guides as required.  Verified that patient has previously received a Conservation officer, historic buildings and a Surveyor, mining.    The patient or caregiver noted above participated in the development of this care plan and knows that they can request review of or adjustments to the care plan at any time. All of the patient's questions and concerns have been addressed.    Julianne Rice   [facility] Specialty Pharmacist

## 2021-08-06 MED FILL — OLUMIANT 2 MG TABLET: ORAL | 30 days supply | Qty: 30 | Fill #1

## 2021-08-28 ENCOUNTER — Telehealth: Admit: 2021-08-28 | Discharge: 2021-08-29 | Payer: PRIVATE HEALTH INSURANCE

## 2021-08-28 DIAGNOSIS — J069 Acute upper respiratory infection, unspecified: Principal | ICD-10-CM

## 2021-08-28 NOTE — Unmapped (Signed)
[facility] ENT Encounter  This medical encounter was conducted virtually using [email or web address] protocols.    Patient ID: Bonnie Harvey is a 55 y.o. female who presents by video interaction for evaluation.    I have identified myself to the patient and conveyed my credentials to [employer].   Patient has signed informed consent on file in medical record.    Present on Video Call: Is there someone else in the room? No.    Assessment/Plan:      Problem List Items Addressed This Visit        Respiratory    Viral URI with cough - Primary     Reports pain with swallowing, congestion, discolored mucus, sore throat, cough, tender/swollen lymph nodes.  Started about 6 days ago.  Negative COVID.  Does feel significantly better today.  Advised to continue throat spray, switch to flonase nasal spray, and continue tylenol sinus.  Follow up if symptoms persist or worsen.  Patient verbalizes understanding and has no further questions at this time.          Medication adherence and barriers to the treatment plan have been addressed. Opportunities to optimize healthy behaviors have been discussed. Patient / caregiver voiced understanding.     Subjective:     HPI  Bonnie Harvey is 55 y.o. and presents today in the [facility] with ENT symptoms.  The PCP for this patient is Fatima Blank, MD.     Patient reports she started with symptoms 6 days ago.  5 days ago, she got her COVID and flu shot.    Reports pain with swallowing, congestion, discolored mucus, sore throat, cough.  Tender/swollen lymph nodes.  No fever.  Negative COVID test yesterday.  Taking alka-seltzer, mucinex throat spray, mucinex nasal spray, mucinex liquid, tylenol sinus and headache.    Both her grand kids have been sick with colds.    ROS  Review of Systems     All other ROS per HPI.    I have reviewed the problem list, past medical history, past family history, medications, and allergies and have updated/reconciled them if needed. Objective:   Physical Exam  As part of this Video Visit, no in-person exam was conducted.  Video interaction permitted the following observations.    General: No acute distress.   HEENT: No visible mass or abnormality of neck.   RESP: Relaxed respiratory effort. No conversational dyspnea.   SKIN: No rashes noted.  NEURO: Normal coordination.  No tremors observed.  PSYCH: Alert and oriented.  Speech fluent and sensible.  Calm affect.       The patient reports they are currently: at home. I spent 11 minutes on the real-time audio and video with the patient on the date of service. I spent an additional 3 minutes on pre- and post-visit activities on the date of service.     The patient was physically located in [place] or a state in which I am permitted to provide care. The patient and/or parent/guardian understood that s/he may incur co-pays and cost sharing, and agreed to the telemedicine visit. The visit was reasonable and appropriate under the circumstances given the patient's presentation at the time.    The patient and/or parent/guardian has been advised of the potential risks and limitations of this mode of treatment (including, but not limited to, the absence of in-person examination) and has agreed to be treated using telemedicine. The patient's/patient's family's questions regarding telemedicine have been answered.  If the visit was completed in an ambulatory setting, the patient and/or parent/guardian has also been advised to contact their provider???s office for worsening conditions, and seek emergency medical treatment and/or call 911 if the patient deems either necessary.

## 2021-08-28 NOTE — Unmapped (Signed)
Virtual Care Outreach Results as of:  August 28, 2021 1:33 PM        Date of Patients Video: 08/28/21  Time of Patients Video Visit: 1320  Call Attempt's (1-5): 2  Did you speak with the patient?: yes  Confirmed patient's web browser: yes  Confirmed MyChart Login: yes  Confirmed Get Started Completed : yes  Confirmed Camera: yes  Confirmed Microphone: yes  Confirmed Audio: yes  Provided Support: Yes (Pt asked to convert to phone visit since taking care of her grandchild at the moment.)   Reason no support provided: n/a  Patient Satisfied: yes

## 2021-08-28 NOTE — Unmapped (Signed)
Virtual Care Outreach Results as of:  August 28, 2021 12:55 PM        Date of Patients Video: 08/28/21  Time of Patients Video Visit: 1320 (called early per ok from provider Melba Coon))  Call Attempt's (1-5): 1  Did you speak with the patient?: n/a  Confirmed patient's web browser: n/a  Confirmed MyChart Login: n/a  Confirmed Get Started Completed : n/a  Confirmed Camera: n/a  Confirmed Microphone: n/a  Confirmed Audio: n/a  Provided Support: No   Reason no support provided: unable to reach (No answer on first attempt; no vm option available.)  Patient Satisfied: n/a

## 2021-08-28 NOTE — Unmapped (Signed)
Reports pain with swallowing, congestion, discolored mucus, sore throat, cough, tender/swollen lymph nodes.  Started about 6 days ago.  Negative COVID.  Does feel significantly better today.  Advised to continue throat spray, switch to flonase nasal spray, and continue tylenol sinus.  Follow up if symptoms persist or worsen.  Patient verbalizes understanding and has no further questions at this time.

## 2021-08-29 NOTE — Unmapped (Signed)
[facility] Refill Coordination Note    Specialty Medication(s) to be Shipped:   Inflammatory Disorders: Olumiant    Other medication(s) to be shipped: No additional medications requested for fill at this time     Bonnie Harvey, DOB: 1966-01-20  Phone: [phone] (home)       All above HIPAA information was verified with patient.     Was a Nurse, learning disability used for this call? No    Completed refill call assessment today to schedule patient's medication shipment from the [facility] [phone]).  All relevant notes have been reviewed.     Specialty medication(s) and dose(s) confirmed: Regimen is correct and unchanged.   Changes to medications: [place] reports no changes at this time.  Changes to insurance: No  New side effects reported not previously addressed with a pharmacist or physician: None reported  Questions for the pharmacist: No    Confirmed patient received a Conservation officer, historic buildings and a Surveyor, mining with first shipment. The patient will receive a drug information handout for each medication shipped and additional FDA Medication Guides as required.       DISEASE/MEDICATION-SPECIFIC INFORMATION        N/A    SPECIALTY MEDICATION ADHERENCE     Medication Adherence    Patient reported X missed doses in the last month: 0  Specialty Medication: OLUMIANT  Patient is on additional specialty medications: No              Were doses missed due to medication being on hold? No    Olumiant 2 mg: 7-10 days of medicine on hand        REFERRAL TO PHARMACIST     Referral to the pharmacist: Not needed      [facility]     Shipping address confirmed in Epic.     Delivery Scheduled: Yes, Expected medication delivery date: 09/03/21.     Medication will be delivered via UPS to the prescription address in Epic [facility].    Unk Lightning   [facility] Specialty Technician

## 2021-08-31 ENCOUNTER — Ambulatory Visit
Admit: 2021-08-31 | Discharge: 2021-09-01 | Payer: PRIVATE HEALTH INSURANCE | Attending: Family Medicine | Primary: Family Medicine

## 2021-08-31 DIAGNOSIS — R051 Acute cough: Principal | ICD-10-CM

## 2021-08-31 DIAGNOSIS — J329 Chronic sinusitis, unspecified: Principal | ICD-10-CM

## 2021-08-31 DIAGNOSIS — B9689 Other specified bacterial agents as the cause of diseases classified elsewhere: Principal | ICD-10-CM

## 2021-08-31 MED ORDER — BENZONATATE 200 MG CAPSULE
ORAL_CAPSULE | Freq: Three times a day (TID) | ORAL | 0 refills | 7 days | Status: CP | PRN
Start: 2021-08-31 — End: 2021-09-07

## 2021-08-31 MED ORDER — AMOXICILLIN 875 MG-POTASSIUM CLAVULANATE 125 MG TABLET
ORAL_TABLET | Freq: Two times a day (BID) | ORAL | 0 refills | 7 days | Status: CP
Start: 2021-08-31 — End: 2021-09-07

## 2021-09-01 NOTE — Unmapped (Signed)
08/31/21     Bonnie Igo, FNP    Assessment/Plan:      Bacterial sinusitis  - RAPID INFLUENZA/RSV/COVID PCR  - amoxicillin-clavulanate (AUGMENTIN) 875-125 mg per tablet; Take 1 tablet by mouth Two (2) times a day for 7 days.      Bonnie Harvey is a 55 y.o. female, ill-appearing, with flu-like symptoms.    Will prescribe antibiotic given length of reported sinusitis symptoms > 10 days. Possibly viral upper respiratory infection. COVID-19, influenza and RSV test ordered. Recommended supportive care with rest, attention to adequate hydration, food as tolerated. May take tylenol/ibuprofen to treat fever/chills/body aches. May use OTC cough/cold preparations for symptom control.         Discussed follow up with primary care provider as needed, return parameters including no resolution or any worsening concerns. Parents verbalized understanding and agreed to plan.      Pertinent labs & imaging results that were available during my care of the patient were reviewed by me and considered in my medical decision making (see chart for details).    Preventive services addressed today   none      Medications ordered during this encounter   Medications   ??? amoxicillin-clavulanate (AUGMENTIN) 875-125 mg per tablet     Sig: Take 1 tablet by mouth Two (2) times a day for 7 days.     Dispense:  14 tablet     Refill:  0   ??? benzonatate (TESSALON) 200 MG capsule     Sig: Take 1 capsule (200 mg total) by mouth Three (3) times a day as needed for cough for up to 7 days.     Dispense:  21 capsule     Refill:  0        I discussed with the patient the new prescription noted above including potential side effects, drug interactions and proper dosing.    No follow-ups on file.    Medication adherence and barriers to the treatment plan have been addressed. Opportunities to optimize healthy behaviors have been discussed. Patient / caregiver voiced understanding.      Greater than 50% of this face to face visit was spent counseling on patient problems, addressing therapeutic goals, recommendations and treatments including medications and their doses along with the complications when mixed with other medications, coordination of treatment for other providers and appointments.      I personally spent 25 minutes face-to-face and non-face-to-face in the care of this patient, which includes all pre, intra, and post visit time on the date of service.    Subjective:        Bonnie Harvey is a 55 y.o. female is here for an acute office visit and reports the following:    Chief Complaint   Patient presents with   ??? Cough     Pt states she has had cough with thick sputum,nasal congestion and headache with sore throat.       This patients last WCC/CPE date: : 07/23/2021    HPI:       Presents with URI symptoms since 10 days ago. She had VV a few days ago and was told viral likely. Symptoms have not improved. Severe sore throat, swollen glands, sinus headache, nasal congestion.    Past Medical History:   Diagnosis Date   ??? Alopecia    ??? Anxiety    ??? Depression    ??? Eczema    ??? Migraine        Past Surgical History:  Procedure Laterality Date   ??? CHOLECYSTECTOMY     ??? SKIN BIOPSY         Family History   Problem Relation Age of Onset   ??? Diabetes Mother    ??? Dementia Mother    ??? Skin cancer Mother    ??? Melanoma Mother    ??? Alzheimer's disease Mother    ??? Hyperlipidemia Father    ??? Colon cancer Maternal Grandmother    ??? Leukemia Paternal Grandmother    ??? Testicular cancer Son    ??? Basal cell carcinoma Neg Hx    ??? Squamous cell carcinoma Neg Hx        Social History     Socioeconomic History   ??? Marital status: Married   ??? Number of children: 2   Tobacco Use   ??? Smoking status: Former     Types: Cigarettes     Quit date: 2014     Years since quitting: 8.8   ??? Smokeless tobacco: Never   ??? Tobacco comments:     Vaped nicotine until 2019   Vaping Use   ??? Vaping Use: Former   Substance and Sexual Activity   ??? Alcohol use: Yes     Comment: rarely   ??? Drug use: No   ??? Sexual activity: Yes     Partners: Male     Birth control/protection: Vasectomy   Other Topics Concern   ??? Do you use sunscreen? Yes   ??? Tanning bed use? No   ??? Are you easily burned? No   ??? Excessive sun exposure? No   ??? Blistering sunburns? No        Allergies:     Shellfish containing products and Sulfa (sulfonamide antibiotics)    Current Medications:     Current Outpatient Medications   Medication Sig Dispense Refill   ??? albuterol HFA 90 mcg/actuation inhaler Inhale 2 puffs.     ??? atenoloL (TENORMIN) 50 MG tablet Take 1 tablet (50 mg total) by mouth in the morning. 30 tablet 12   ??? baricitinib (OLUMIANT) 2 mg Tab tablet Take 1 tablet (2 mg total) by mouth daily. 90 tablet 3   ??? clobetasoL (TEMOVATE) 0.05 % external solution Apply topically to scalp twice daily as needed. 50 mL 4   ??? clobetasoL (TEMOVATE) 0.05 % ointment Apply to affected areas around nails twice daily x 2 weeks 60 g 1   ??? escitalopram oxalate (LEXAPRO) 10 MG tablet Take 1 tablet (10 mg total) by mouth daily. 90 tablet 4   ??? folic acid (FOLVITE) 1 MG tablet Take 3 tablets on days that you do not take methotrexate. 100 tablet 11   ??? levocetirizine (XYZAL) 5 MG tablet Take 5 mg by mouth every evening.     ??? multivitamin (THERAGRAN) per tablet Take 1 tablet by mouth.     ??? progesterone (PROMETRIUM) 100 MG capsule Take one cap po at bedtime, stopping for 5 days with menstrual periods. 90 capsule 3   ??? amoxicillin-clavulanate (AUGMENTIN) 875-125 mg per tablet Take 1 tablet by mouth Two (2) times a day for 7 days. 14 tablet 0   ??? benzonatate (TESSALON) 200 MG capsule Take 1 capsule (200 mg total) by mouth Three (3) times a day as needed for cough for up to 7 days. 21 capsule 0   ??? fluticasone (FLONASE) 50 mcg/actuation nasal spray 2 sprays by Each Nare route daily. 16 g 11   ??? meloxicam (MOBIC) 15 MG tablet Continuous. (  Patient not taking: Reported on 08/31/2021)     ??? methylPREDNISolone (MEDROL DOSEPACK) 4 mg tablet Take 1 tablet (4 mg total) by mouth daily. follow package directions (Patient not taking: Reported on 08/31/2021) 1 tablet 0   ??? naproxen (NAPROSYN) 500 MG tablet 1 tab As needed for onset of migraine (Patient not taking: Reported on 08/31/2021) 20 tablet 11   ??? PAXLOVID CO-PACK, EUA, (PAXLOVID CO-PACK, EUA,) 300 mg (150 mg x 2)-100 mg tablet See package instructions. (Patient not taking: Reported on 08/31/2021) 30 tablet 0   ??? tiZANidine (ZANAFLEX) 4 MG tablet Start with 1 PO at bedtime and slowly increase to 3 PO at bedtime. (Patient not taking: Reported on 08/31/2021) 90 tablet 6   ??? topiramate (TOPAMAX) 25 MG tablet Wean up to taking 2 PO in the morning and 3 PO at night. (Patient not taking: Reported on 08/31/2021) 150 tablet 6   ??? traMADoL (ULTRAM) 50 mg tablet every six (6) hours. (Patient not taking: Reported on 08/31/2021)     ??? ubrogepant (UBRELVY) 100 mg tablet Take one at the onset of a migraine, can repeat in 2 hours if needed. Only treat 8 migraines per month. (Patient not taking: Reported on 08/31/2021) 10 tablet 6     No current facility-administered medications for this visit.       ROS:     Pertinent positives and negatives are indicated in the PHI.  All other systems are negative.    Vital Signs:     Vitals:    08/31/21 1713   BP: 125/76   Pulse: 76   Resp: 20   Temp: 36.9 ??C (98.4 ??F)   SpO2: 98%       Estimated body mass index is 32.12 kg/m?? as calculated from the following:    Height as of this encounter: 154.9 cm (5' 1).    Weight as of this encounter: 77.1 kg (170 lb).  Facility age limit for growth percentiles is 20 years.        Physical Exam:   Physical Exam  Vitals reviewed.   Constitutional:       General: She is not in acute distress.     Appearance: Normal appearance. She is ill-appearing.   HENT:      Nose:      Right Sinus: Frontal sinus tenderness present.      Left Sinus: Frontal sinus tenderness present.   Cardiovascular:      Rate and Rhythm: Normal rate and regular rhythm.      Pulses: Normal pulses. Heart sounds: Normal heart sounds.   Pulmonary:      Effort: Pulmonary effort is normal.      Breath sounds: Normal breath sounds.   Skin:     General: Skin is warm and dry.   Neurological:      General: No focal deficit present.      Mental Status: She is alert and oriented to person, place, and time.   Psychiatric:         Mood and Affect: Mood normal.         Behavior: Behavior normal.

## 2021-09-03 MED FILL — OLUMIANT 2 MG TABLET: ORAL | 30 days supply | Qty: 30 | Fill #2

## 2021-09-11 ENCOUNTER — Encounter
Admit: 2021-09-11 | Discharge: 2021-09-11 | Payer: PRIVATE HEALTH INSURANCE | Attending: Anesthesiology | Primary: Anesthesiology

## 2021-09-11 ENCOUNTER — Ambulatory Visit: Admit: 2021-09-11 | Discharge: 2021-09-11 | Payer: PRIVATE HEALTH INSURANCE

## 2021-09-11 MED ADMIN — propofoL (DIPRIVAN) injection: INTRAVENOUS | @ 20:00:00 | Stop: 2021-09-11

## 2021-09-11 MED ADMIN — lidocaine (XYLOCAINE) 20 mg/mL (2 %) injection: INTRAVENOUS | @ 20:00:00 | Stop: 2021-09-11

## 2021-09-11 MED ADMIN — sodium chloride (NS) 0.9 % infusion: 10 mL/h | INTRAVENOUS | @ 20:00:00 | Stop: 2021-09-11

## 2021-09-26 NOTE — Unmapped (Signed)
[facility] Refill Coordination Note    Specialty Medication(s) to be Shipped:   Inflammatory Disorders: Olumiant    Other medication(s) to be shipped: No additional medications requested for fill at this time     Bonnie Harvey, DOB: 1965/11/25  Phone: [phone] (home)       All above HIPAA information was verified with patient.     Was a Nurse, learning disability used for this call? No    Completed refill call assessment today to schedule patient's medication shipment from the [facility] [phone]).  All relevant notes have been reviewed.     Specialty medication(s) and dose(s) confirmed: Regimen is correct and unchanged.   Changes to medications: [name] reports no changes at this time.  Changes to insurance: No  New side effects reported not previously addressed with a pharmacist or physician: None reported  Questions for the pharmacist: No    Confirmed patient received a Conservation officer, historic buildings and a Surveyor, mining with first shipment. The patient will receive a drug information handout for each medication shipped and additional FDA Medication Guides as required.       DISEASE/MEDICATION-SPECIFIC INFORMATION        N/A    SPECIALTY MEDICATION ADHERENCE     Medication Adherence    Patient reported X missed doses in the last month: 0  Specialty Medication: Olumiant  Patient is on additional specialty medications: No  Any gaps in refill history greater than 2 weeks in the last 3 months: no  Demonstrates understanding of importance of adherence: yes  Informant: patient  Reliability of informant: reliable  Confirmed plan for next specialty medication refill: delivery by pharmacy  Refills needed for supportive medications: not needed              Were doses missed due to medication being on hold? No    Olumiant 2 mg: 7-10 days of medicine on hand        REFERRAL TO PHARMACIST     Referral to the pharmacist: Not needed      [facility]     Shipping address confirmed in Epic.     Delivery Scheduled: Yes, Expected medication delivery date: 10/01/2021.     Medication will be delivered via UPS to the prescription address in Epic [place].    Bonnie Harvey Bonnie Harvey Bonnie Harvey   [facility] Specialty Technician

## 2021-09-30 MED FILL — OLUMIANT 2 MG TABLET: ORAL | 30 days supply | Qty: 30 | Fill #3

## 2021-10-01 MED ORDER — RIZATRIPTAN 10 MG TABLET
ORAL_TABLET | 5 refills | 0 days | Status: CP
Start: 2021-10-01 — End: ?

## 2021-10-01 NOTE — Unmapped (Signed)
Patient is requesting the following refill  Requested Prescriptions     Pending Prescriptions Disp Refills    rizatriptan (MAXALT) 10 MG tablet [Pharmacy Med Name: RIZATRIPTAN BENZOATE 10 MG TAB] 9 tablet 5     Sig: TAKE 1 TABLET BY MOUTH FOR MIGRAINE. MAY REPEAT IN 2 HOURS. USE NO MORE THAN 2 TABS/24 HOURS OR 2-3 DAYS/WEEK.       Recent Visits  Date Type Provider Dept   01/31/21 Office Visit Brad Almira Bar, MD [facility]   Showing recent visits within past 365 days with a meds authorizing provider and meeting all other requirements  Future Appointments  No visits were found meeting these conditions.  Showing future appointments within next 365 days with a meds authorizing provider and meeting all other requirements       Labs: Not applicable this refill

## 2021-10-25 NOTE — Unmapped (Signed)
[facility] Refill Coordination Note    Specialty Medication(s) to be Shipped:   Inflammatory Disorders: Olumiant    Other medication(s) to be shipped: No additional medications requested for fill at this time     Bonnie Harvey, DOB: 25-Dec-1965  Phone: [phone] (home)       All above HIPAA information was verified with patient.     Was a Nurse, learning disability used for this call? No    Completed refill call assessment today to schedule patient's medication shipment from the [facility] [phone]).  All relevant notes have been reviewed.     Specialty medication(s) and dose(s) confirmed: Regimen is correct and unchanged.   Changes to medications: [name] reports no changes at this time.  Changes to insurance: No  New side effects reported not previously addressed with a pharmacist or physician: None reported  Questions for the pharmacist: No    Confirmed patient received a Conservation officer, historic buildings and a Surveyor, mining with first shipment. The patient will receive a drug information handout for each medication shipped and additional FDA Medication Guides as required.       DISEASE/MEDICATION-SPECIFIC INFORMATION        N/A    SPECIALTY MEDICATION ADHERENCE     Medication Adherence    Patient reported X missed doses in the last month: 0  Specialty Medication: OLUMIANT 2 mg  Patient is on additional specialty medications: No  Any gaps in refill history greater than 2 weeks in the last 3 months: no  Demonstrates understanding of importance of adherence: yes  Informant: patient  Reliability of informant: reliable  Confirmed plan for next specialty medication refill: delivery by pharmacy  Refills needed for supportive medications: not needed              Were doses missed due to medication being on hold? No    Olumiant 2 mg: 18 (not including today) days of medicine on hand        REFERRAL TO PHARMACIST     Referral to the pharmacist: Not needed      [name]     Shipping address confirmed in Epic.     Delivery Scheduled: Yes, Expected medication delivery date: 11/05/2021.     Medication will be delivered via Next Day Courier to the prescription address in Epic WAM.    Bonnie Harvey   [facility]: [facility] Specialty Technician

## 2021-11-04 MED FILL — OLUMIANT 2 MG TABLET: ORAL | 30 days supply | Qty: 30 | Fill #4

## 2021-12-05 NOTE — Unmapped (Signed)
[facility] Refill Coordination Note    Specialty Medication(s) to be Shipped:   Inflammatory Disorders: Olumiant    Other medication(s) to be shipped: No additional medications requested for fill at this time     Bonnie Harvey, DOB: [birth year]  Phone: [phone] (home)       All above HIPAA information was verified with patient.     Was a Nurse, learning disability used for this call? No    Completed refill call assessment today to schedule patient's medication shipment from the [facility] [phone]).  All relevant notes have been reviewed.     Specialty medication(s) and dose(s) confirmed: Regimen is correct and unchanged.   Changes to medications: Ruwayda reports no changes at this time.  Changes to insurance: No  New side effects reported not previously addressed with a pharmacist or physician: None reported  Questions for the pharmacist: No    Confirmed patient received a Conservation officer, historic buildings and a Surveyor, mining with first shipment. The patient will receive a drug information handout for each medication shipped and additional FDA Medication Guides as required.       DISEASE/MEDICATION-SPECIFIC INFORMATION        N/A    SPECIALTY MEDICATION ADHERENCE     Medication Adherence    Patient reported X missed doses in the last month: 0  Specialty Medication: Olumiant  Patient is on additional specialty medications: No  Patient is on more than two specialty medications: No  Any gaps in refill history greater than 2 weeks in the last 3 months: no  Demonstrates understanding of importance of adherence: yes  Informant: patient              Were doses missed due to medication being on hold? No    Olumiant 2mg : Patient has 6 days of medication on hand    REFERRAL TO PHARMACIST     Referral to the pharmacist: Not needed      [facility]     Shipping address confirmed in Epic.     Delivery Scheduled: Yes, Expected medication delivery date: 2/28.     Medication will be delivered via Next Day Courier to the prescription address in Epic WAM.    Olga Millers   [facility] Specialty Technician

## 2021-12-09 MED FILL — OLUMIANT 2 MG TABLET: ORAL | 30 days supply | Qty: 30 | Fill #5

## 2022-01-08 NOTE — Unmapped (Signed)
[facility] Refill Coordination Note    Specialty Medication(s) to be Shipped:   Inflammatory Disorders: Olumiant    Other medication(s) to be shipped: No additional medications requested for fill at this time     Bonnie Harvey, DOB: 1966-04-13  Phone: [phone] (home)       All above HIPAA information was verified with patient.     Was a Nurse, learning disability used for this call? No    Completed refill call assessment today to schedule patient's medication shipment from the [facility] [phone]).  All relevant notes have been reviewed.     Specialty medication(s) and dose(s) confirmed: Regimen is correct and unchanged.   Changes to medications: Bonnie Harvey reports no changes at this time.  Changes to insurance: No  New side effects reported not previously addressed with a pharmacist or physician: None reported  Questions for the pharmacist: No    Confirmed patient received a Conservation officer, historic buildings and a Surveyor, mining with first shipment. The patient will receive a drug information handout for each medication shipped and additional FDA Medication Guides as required.       DISEASE/MEDICATION-SPECIFIC INFORMATION        N/A    SPECIALTY MEDICATION ADHERENCE     Medication Adherence    Patient reported X missed doses in the last month: 0  Specialty Medication: OLUMIANT 2MG   Patient is on additional specialty medications: No  Informant: patient              Were doses missed due to medication being on hold? No    OLUMIANT 2MG : 1 days of medicine on hand       REFERRAL TO PHARMACIST     Referral to the pharmacist: Not needed      [facility]     Shipping address confirmed in Epic.     Delivery Scheduled: Yes, Expected medication delivery date: 01/10/22.     Medication will be delivered via Next Day Courier to the prescription address in Epic [place].    Bonnie Harvey   [facility] Specialty Technician

## 2022-01-09 MED FILL — OLUMIANT 2 MG TABLET: ORAL | 30 days supply | Qty: 30 | Fill #6

## 2022-01-11 ENCOUNTER — Ambulatory Visit: Admit: 2022-01-11 | Discharge: 2022-01-12 | Payer: PRIVATE HEALTH INSURANCE

## 2022-01-31 NOTE — Unmapped (Signed)
[facility] Pharm[facility] Refill Coordination Note    Specialty Medication(s) to be Shipped:   Inflammatory Disorders: Olumiant 2mg     Other medication(s) to be shipped: No additional medications requested for fill at this time     Idonia Zollinger, DOB: Oct 23, 1965  Phone: [phone] (home)       All above HIPAA information was verified with patient.     Was a Nurse, learning disability used for this call? No    Completed refill call assessment today to schedule patient's medication shipment from the [facility] [phone]).  All relevant notes have been reviewed.     Specialty medication(s) and dose(s) confirmed: Regimen is correct and unchanged.   Changes to medications: Akeya reports no changes at this time.  Changes to insurance: No  New side effects reported not previously addressed with a pharmacist or physician: None reported  Questions for the pharmacist: No    Confirmed patient received a Conservation officer, historic buildings and a Surveyor, mining with first shipment. The patient will receive a drug information handout for each medication shipped and additional FDA Medication Guides as required.       DISEASE/MEDICATION-SPECIFIC INFORMATION        N/A    SPECIALTY MEDICATION ADHERENCE     Medication Adherence    Patient reported X missed doses in the last month: 0  Specialty Medication: OLUMIANT 2 mg Tab tablet (baricitinib)  Patient is on additional specialty medications: No  Informant: patient              Were doses missed due to medication being on hold? No    Olumiant 2 mg: 9 days of medicine on hand        REFERRAL TO PHARMACIST     Referral to the pharmacist: Not needed      [facility]     Shipping address confirmed in Epic.     Delivery Scheduled: Yes, Expected medication delivery date: 02/05/22.     Medication will be delivered via Next Day Courier to the prescription address in Epic WAM.    Jasper Loser   [facility] Specialty Technician

## 2022-02-04 MED FILL — OLUMIANT 2 MG TABLET: ORAL | 30 days supply | Qty: 30 | Fill #7

## 2022-02-13 MED ORDER — ESCITALOPRAM 10 MG TABLET
ORAL_TABLET | 4 refills | 0 days
Start: 2022-02-13 — End: ?

## 2022-02-13 MED ORDER — ATENOLOL 50 MG TABLET
ORAL_TABLET | 12 refills | 0 days
Start: 2022-02-13 — End: ?

## 2022-02-14 MED ORDER — ESCITALOPRAM 10 MG TABLET
ORAL_TABLET | Freq: Every day | ORAL | 4 refills | 0.00000 days | Status: CP
Start: 2022-02-14 — End: ?

## 2022-02-14 MED ORDER — ATENOLOL 50 MG TABLET
ORAL_TABLET | Freq: Every day | ORAL | 12 refills | 0.00000 days | Status: CP
Start: 2022-02-14 — End: 2023-02-14

## 2022-02-14 NOTE — Unmapped (Signed)
Patient is requesting the following refill  Requested Prescriptions     Pending Prescriptions Disp Refills    escitalopram oxalate (LEXAPRO) 10 MG tablet [Pharmacy Med Name: ESCITALOPRAM OXALATE 10 MG TAB] 90 tablet 4     Sig: TAKE 1 TABLET BY MOUTH ONCE DAILY    atenoloL (TENORMIN) 50 MG tablet [Pharmacy Med Name: ATENOLOL 50 MG TAB] 30 tablet 12     Sig: TAKE 1 TABLET BY MOUTH ONCE EVERY MORNING       Escitalopram: Last refill given on: 01/31/21 with 90 count and 4 refills.     Atenolol: Last refill given on: 03/21/21 with 30 count and 12 refills.    Last OV: Visit date not found   Next OV: Visit date not found

## 2022-02-28 NOTE — Unmapped (Signed)
[facility] Refill Coordination Note    Specialty Medication(s) to be Shipped:   Inflammatory Disorders: Olumiant    Other medication(s) to be shipped: No additional medications requested for fill at this time     Bonnie Harvey, DOB: Apr 18, 1966  Phone: There are no phone numbers on file.      All above HIPAA information was verified with patient.     Was a Nurse, learning disability used for this call? No    Completed refill call assessment today to schedule patient's medication shipment from the [facility] [phone]).  All relevant notes have been reviewed.     Specialty medication(s) and dose(s) confirmed: Regimen is correct and unchanged.   Changes to medications: Daffney reports no changes at this time.  Changes to insurance: No  New side effects reported not previously addressed with a pharmacist or physician: None reported  Questions for the pharmacist: No    Confirmed patient received a Conservation officer, historic buildings and a Surveyor, mining with first shipment. The patient will receive a drug information handout for each medication shipped and additional FDA Medication Guides as required.       DISEASE/MEDICATION-SPECIFIC INFORMATION        N/A    SPECIALTY MEDICATION ADHERENCE     Medication Adherence    Patient reported X missed doses in the last month: 0  Specialty Medication: Olumiant  Patient is on additional specialty medications: No  Patient is on more than two specialty medications: No  Any gaps in refill history greater than 2 weeks in the last 3 months: no  Demonstrates understanding of importance of adherence: yes  Informant: patient  Reliability of informant: reliable  Provider-estimated medication adherence level: good  Patient is at risk for Non-Adherence: No  Reasons for non-adherence: no problems identified  Confirmed plan for next specialty medication refill: delivery by pharmacy  Refills needed for supportive medications: not needed          Refill Coordination    Has the Patients' Contact Information Changed: No  Is the Shipping Address Different: No         Were doses missed due to medication being on hold? No    olumiant 2   mg: 11 days of medicine on hand       Referral to the pharmacist: Not needed      [facility]     Shipping address confirmed in Epic.     Delivery Scheduled: Yes, Expected medication delivery date: 05/24.     Medication will be delivered via Next Day Courier to the prescription address in Epic WAM.    Antonietta Barcelona   [facility] Specialty Technician

## 2022-03-04 MED FILL — OLUMIANT 2 MG TABLET: ORAL | 30 days supply | Qty: 30 | Fill #8

## 2022-03-13 DIAGNOSIS — N951 Menopausal and female climacteric states: Principal | ICD-10-CM

## 2022-03-13 MED ORDER — PROGESTERONE MICRONIZED 100 MG CAPSULE
ORAL_CAPSULE | 3 refills | 0.00000 days
Start: 2022-03-13 — End: ?

## 2022-03-31 NOTE — Unmapped (Signed)
[facility] Pharm[facility] Refill Coordination Note    Specialty Medication(s) to be Shipped:   Inflammatory Disorders: Olumiant    Other medication(s) to be shipped: No additional medications requested for fill at this time     Bonnie Harvey, DOB: [birth year]  Phone: There are no phone numbers on file.      All above HIPAA information was verified with patient.     Was a Nurse, learning disability used for this call? No    Completed refill call assessment today to schedule patient's medication shipment from the [facility] [phone]).  All relevant notes have been reviewed.     Specialty medication(s) and dose(s) confirmed: Regimen is correct and unchanged.   Changes to medications: Bonnie Harvey reports no changes at this time.  Changes to insurance: No  New side effects reported not previously addressed with a pharmacist or physician: None reported  Questions for the pharmacist: No    Confirmed patient received a Conservation officer, historic buildings and a Surveyor, mining with first shipment. The patient will receive a drug information handout for each medication shipped and additional FDA Medication Guides as required.       DISEASE/MEDICATION-SPECIFIC INFORMATION        N/A    SPECIALTY MEDICATION ADHERENCE     Medication Adherence    Patient reported X missed doses in the last month: 0  Specialty Medication: Olumiant  Patient is on additional specialty medications: No  Any gaps in refill history greater than 2 weeks in the last 3 months: no  Demonstrates understanding of importance of adherence: yes  Informant: patient  Reliability of informant: reliable  Confirmed plan for next specialty medication refill: delivery by pharmacy  Refills needed for supportive medications: not needed              Were doses missed due to medication being on hold? No    Olumiant 2 mg: 10 days of medicine on hand       Referral to the pharmacist: Not needed      [facility]     Shipping address confirmed in Epic.     Delivery Scheduled: Yes, Expected medication delivery date: 04/03/2022.     Medication will be delivered via Next Day Courier to the prescription address in Epic WAM.    Tiarah Shisler D Shastina Rua   [facility] Specialty Technician

## 2022-04-02 MED FILL — OLUMIANT 2 MG TABLET: ORAL | 30 days supply | Qty: 30 | Fill #9

## 2022-04-16 DIAGNOSIS — L639 Alopecia areata, unspecified: Principal | ICD-10-CM

## 2022-05-01 ENCOUNTER — Ambulatory Visit
Admission: EM | Admit: 2022-05-01 | Discharge: 2022-05-01 | Disposition: A | Discharge status: Home / Self Care | Payer: BLUE CROSS/BLUE SHIELD | Attending: Emergency Medicine | Admitting: Emergency Medicine

## 2022-05-01 DIAGNOSIS — R3989 Other symptoms and signs involving the genitourinary system: Secondary | ICD-10-CM | POA: Insufficient documentation

## 2022-05-01 LAB — URINALYSIS, ROUTINE W REFLEX MICROSCOPIC
Bilirubin Urine: NEGATIVE
Glucose, UA: NEGATIVE mg/dL
Ketones, ur: NEGATIVE mg/dL
Leukocytes,Ua: NEGATIVE
Nitrite: NEGATIVE
Protein, ur: NEGATIVE mg/dL
Specific Gravity, Urine: 1.025 (ref 1.005–1.030)
pH: 5.5 (ref 5.0–8.0)

## 2022-05-01 LAB — WET PREP, GENITAL
Clue Cells Wet Prep HPF POC: NONE SEEN
Sperm: NONE SEEN
Trich, Wet Prep: NONE SEEN
WBC, Wet Prep HPF POC: NONE SEEN — AB (ref <10)
Yeast Wet Prep HPF POC: NONE SEEN

## 2022-05-01 LAB — URINALYSIS, MICROSCOPIC (REFLEX)

## 2022-05-01 NOTE — Unmapped (Signed)
Pt called with c/o frequent urination, urgency and some back pain. Pt wanted antibiotic called. Pt advised that she would need to come in for evaluation or NV if any appointments with provider are not available. Pt also advised to go to UC. Pt verbalized understanding and will come in tomorrow for urine dip at [facility].

## 2022-05-01 NOTE — ED Triage Notes (Signed)
Patient presents to Urgent Care with complaints of abdominal pressure and urgency x 2 days ago. Treating with cranberry juice.

## 2022-05-01 NOTE — ED Provider Notes (Signed)
[facility]    CSN: [id number] Arrival date & time: 05/01/22  1640      History   Chief Complaint Chief Complaint  Patient presents with   Abdominal Pain   Urinary Urgency    HPI Claudia Harper is a 56 y.o. female.   HPI  56 year old female here for evaluation of urinary symptoms.  Patient reports that for last 2 days she has been experiencing suprapubic bladder pressure, urinary frequency, urinary urgency, and cloudy urine.  She also reports has had a strong odor.  She denies any burning with urination and she denies seeing any blood in her urine though she is currently on her menstrual cycle.  She has not had a fever or low back pain.  Past Medical History:  Diagnosis Date   Anxiety    Depression    Eczema    Injury of foot including toes 02/2014   fracture of left fourth digit   Migraines    Right foot injury    fx of foot, tore tendon, and sprained right ankle 2014;    Patient Active Problem List   Diagnosis Date Noted   Nonspecific syndrome suggestive of viral illness 11/08/2020   Acute URI 11/08/2020   Migraine headache without aura 01/13/2017   Eczema 01/13/2017   Anxiety 01/13/2017   Colon polyps 01/13/2017    Past Surgical History:  Procedure Laterality Date   CHOLECYSTECTOMY  2002   COLONOSCOPY  2015   polyps found, next one in 55 years   [place]   done with cystoscopy   LIPOMA EXCISION  2007   right inner thigh    OB History     Gravida  2   Para  2   Term  2   Preterm      AB      Living  2      SAB      IAB      Ectopic      Multiple      Live Births  2        Obstetric Comments  Patient hospitalized at 89 weeks with G1 for viral illness.          Home Medications    Prior to Admission medications   Medication Sig Start Date End Date Taking? Authorizing Provider  atenolol (TENORMIN) 50 MG tablet TAKE 1 TABLET BY MOUTH ONCE EVERY MORNING 02/14/22  Yes [provider]   albuterol (VENTOLIN HFA) 108 (90 Base) MCG/ACT inhaler Inhale 2 puffs into the lungs every 6 (six) hours as needed for wheezing or shortness of breath. 11/12/20 03/18/21  Katy Apo, NP  SUMAtriptan (IMITREX) 50 MG tablet  01/06/17 11/12/20  [provider]  escitalopram (LEXAPRO) 10 MG tablet  11/05/16   [provider]  folic acid (FOLVITE) 1 MG tablet Take by mouth. 03/02/21   [provider]  methotrexate (RHEUMATREX) 2.5 MG tablet Take 6 tablets by mouth once WEEKLY. 03/01/21   [provider]  OLUMIANT tablet Take 2 mg by mouth daily. 04/02/22   [provider]  progesterone (PROMETRIUM) 100 MG capsule Take 100 mg by mouth daily. 03/13/22   [provider]  rizatriptan (MAXALT) 10 MG tablet TAKE 1 TABLET BY MOUTH AS NEEDED MIGRAINE MAY REPEAT IN 2 HRS IF NEEDED 10/18/20   [provider]    Family History Family History  Problem Relation Age of Onset   Dementia Mother  Hypertension Mother    Diabetes Mother    Diabetes Father    Hypertension Father    Heart block Father    Colon cancer Maternal Grandmother 63   Heart failure Maternal Grandmother    Hypertension Maternal Grandmother    Brain cancer Maternal Grandfather 39   Hypertension Paternal Grandmother    Leukemia Paternal Grandmother 34   Breast cancer Neg Hx     Social History Social History   Tobacco Use   Smoking status: Former    Years: 15.00    Types: Cigarettes    Start date: 1985    Quit date: 2009    Years since quitting: 14.5   Smokeless tobacco: Never  Vaping Use   Vaping Use: Never used  Substance Use Topics   Alcohol use: Yes    Alcohol/week: 0.0 standard drinks of alcohol    Comment: 3 cocktails/week   Drug use: No     Allergies   Shellfish allergy and Sulfa antibiotics   Review of Systems Review of Systems  Constitutional:  Negative for fever.  Gastrointestinal:  Positive for abdominal pain.  Genitourinary:  Positive for  frequency and urgency. Negative for dysuria and hematuria.  Musculoskeletal:  Negative for back pain.  Hematological: Negative.   Psychiatric/Behavioral: Negative.       Physical Exam Triage Vital Signs ED Triage Vitals [05/01/22 1654]  Enc Vitals Group     BP 117/78     Pulse Rate 72     Resp 16     Temp 98.6 F (37 C)     Temp Source Temporal     SpO2 95 %     Weight      Height      Head Circumference      Peak Flow      Pain Score      Pain Loc      Pain Edu?      Excl. in GC?    No data found.  Updated Vital Signs BP 117/78 (BP Location: Right Arm)   Pulse 72   Temp 98.6 F (37 C) (Temporal)   Resp 16   LMP 04/29/2022   SpO2 95%   Visual Acuity Right Eye Distance:   Left Eye Distance:   Bilateral Distance:    Right Eye Near:   Left Eye Near:    Bilateral Near:     Physical Exam Vitals and nursing note reviewed.  Constitutional:      Appearance: Normal appearance. She is not ill-appearing.  HENT:     Head: Normocephalic and atraumatic.  Cardiovascular:     Rate and Rhythm: Normal rate and regular rhythm.     Pulses: Normal pulses.     Heart sounds: Normal heart sounds. No murmur heard.    No friction rub. No gallop.  Pulmonary:     Effort: Pulmonary effort is normal.     Breath sounds: Normal breath sounds. No wheezing, rhonchi or rales.  Abdominal:     General: Abdomen is flat.     Palpations: Abdomen is soft.     Tenderness: There is no abdominal tenderness. There is no right CVA tenderness, left CVA tenderness, guarding or rebound.  Skin:    General: Skin is warm and dry.     Capillary Refill: Capillary refill takes less than 2 seconds.     Findings: No erythema or rash.  Neurological:     General: No focal deficit present.     Mental Status: She is  alert and oriented to person, place, and time.  Psychiatric:        Mood and Affect: Mood normal.        Behavior: Behavior normal.        Thought Content: Thought content normal.         Judgment: Judgment normal.      UC Treatments / Results  Labs (all labs ordered are listed, but only abnormal results are displayed) Labs Reviewed  WET PREP, GENITAL - Abnormal; Notable for the following components:      Result Value   WBC, Wet Prep HPF POC NONE SEEN (*)    All other components within normal limits  URINALYSIS, ROUTINE W REFLEX MICROSCOPIC - Abnormal; Notable for the following components:   APPearance HAZY (*)    Hgb urine dipstick MODERATE (*)    All other components within normal limits  URINALYSIS, MICROSCOPIC (REFLEX) - Abnormal; Notable for the following components:   Bacteria, UA FEW (*)    All other components within normal limits    EKG   Radiology No results found.  Procedures Procedures (including critical care time)  Medications Ordered in UC Medications - No data to display  Initial Impression / Assessment and Plan / UC Course  I have reviewed the triage vital signs and the nursing notes.  Pertinent labs & imaging results that were available during my care of the patient were reviewed by me and considered in my medical decision making (see chart for details).  Patient is a very pleasant, nontoxic-appearing 57 year old female has medical history is significant for anxiety, eczema, migraines presenting for evaluation of urinary frequency, urgency, suprapubic pressure, having cloudy urine x2 days.  She has been drinking cranberry juice but has not been helping her symptoms.  She denies any burning with urination, blood in urine, fever, or low back pain.  Physical exam reveals a benign cardiopulmonary exam with S1-S2 heart sounds with regular rate and rhythm and lung sounds that are clear to auscultation all fields.  No CVA tenderness on exam.  Abdomen is soft and nontender to palpation.  I will order urinalysis to look for the presence of possible infection.  Urinalysis has a hazy appearance and moderate hemoglobin but is negative for leukocyte  esterase, nitrates, or protein.  Reflex micro shows 11-20 RBCs, few bacteria, and 6-10 squamous epithelials.  0-5 RBCs.  I will order a vaginal wet prep.  Vaginal wet prep is negative for yeast, trichomoniasis, or clue cells.  I discussed with the patient that she does not have a urinary tract infection or a vaginal infection.  Her urinary symptoms may be secondary to the fact that she has not had her period in 11 months and her uterus is irritating her bladder.  I suggested that she try some over-the-counter ibuprofen to see if this helps with her symptoms.  If her vaginal bleeding continues, or her urgency and frequency of urination continue, she should follow-up with her OB/GYN.  Patient verbalizes understanding.   Final Clinical Impressions(s) / UC Diagnoses   Final diagnoses:  Sensation of pressure in bladder area     Discharge Instructions      Your testing today did not reveal the presence of a bladder or a vaginal infection.  As we discussed, the sensation you may be feeling may be secondary to your menstrual cycle, which you have not had for the last 11 months.  Try over-the-counter ibuprofen according to the package instructions to see if this helps with  your symptoms.  If your symptoms continue, worsen, and if your vaginal bleeding does not resolve I recommend following up with your OB/GYN.     ED Prescriptions   None    PDMP not reviewed this encounter.   Becky Augusta, NP 05/01/22 [phone]

## 2022-05-01 NOTE — Discharge Instructions (Addendum)
Your testing today did not reveal the presence of a bladder or a vaginal infection.  As we discussed, the sensation you may be feeling may be secondary to your menstrual cycle, which you have not had for the last 11 months.  Try over-the-counter ibuprofen according to the package instructions to see if this helps with your symptoms.  If your symptoms continue, worsen, and if your vaginal bleeding does not resolve I recommend following up with your OB/GYN.

## 2022-05-01 NOTE — Unmapped (Signed)
[facility] Refill Coordination Note    Specialty Medication(s) to be Shipped:   Inflammatory Disorders: Olumiant    Other medication(s) to be shipped: No additional medications requested for fill at this time     Bonnie Harvey, DOB: Jul 28, 1966  Phone: There are no phone numbers on file.      All above HIPAA information was verified with patient.     Was a Nurse, learning disability used for this call? No    Completed refill call assessment today to schedule patient's medication shipment from the [facility] [phone]).  All relevant notes have been reviewed.     Specialty medication(s) and dose(s) confirmed: Regimen is correct and unchanged.   Changes to medications: Bonnie Harvey reports no changes at this time.  Changes to insurance: No  New side effects reported not previously addressed with a pharmacist or physician: None reported  Questions for the pharmacist: No    Confirmed patient received a Conservation officer, historic buildings and a Surveyor, mining with first shipment. The patient will receive a drug information handout for each medication shipped and additional FDA Medication Guides as required.       DISEASE/MEDICATION-SPECIFIC INFORMATION        N/A    SPECIALTY MEDICATION ADHERENCE     Medication Adherence    Patient reported X missed doses in the last month: 0  Specialty Medication: OLUMIANT 2 mg  Patient is on additional specialty medications: No              Were doses missed due to medication being on hold? No    Olumiant 2 mg: 5-6 days of medicine on hand        REFERRAL TO PHARMACIST     Referral to the pharmacist: Not needed      [facility]     Shipping address confirmed in Epic.     Delivery Scheduled: Yes, Expected medication delivery date: 05/06/22.     Medication will be delivered via Next Day Courier to the prescription address in Epic WAM.    Unk Lightning   [facility] Specialty Technician

## 2022-05-02 ENCOUNTER — Ambulatory Visit: Admit: 2022-05-02 | Payer: PRIVATE HEALTH INSURANCE

## 2022-05-05 MED FILL — OLUMIANT 2 MG TABLET: ORAL | 30 days supply | Qty: 30 | Fill #10

## 2022-05-29 DIAGNOSIS — L639 Alopecia areata, unspecified: Principal | ICD-10-CM

## 2022-05-29 MED ORDER — OLUMIANT 2 MG TABLET
ORAL_TABLET | Freq: Every day | ORAL | 3 refills | 90 days
Start: 2022-05-29 — End: ?

## 2022-05-30 MED ORDER — BARICITINIB 2 MG TABLET
ORAL_TABLET | Freq: Every day | ORAL | 3 refills | 90.00000 days
Start: 2022-05-30 — End: ?

## 2022-05-30 NOTE — Unmapped (Signed)
Pt needs a follow up appt

## 2022-06-06 NOTE — Unmapped (Signed)
Call from patient on nurse line. LVM stating she needed to clarify that she was only to take the Olumiant x one year only and does not need to take this medication any longer.

## 2022-06-19 DIAGNOSIS — N951 Menopausal and female climacteric states: Principal | ICD-10-CM

## 2022-06-19 MED ORDER — PROGESTERONE MICRONIZED 100 MG CAPSULE
ORAL_CAPSULE | 3 refills | 0.00000 days
Start: 2022-06-19 — End: ?

## 2022-06-20 DIAGNOSIS — N951 Menopausal and female climacteric states: Principal | ICD-10-CM

## 2022-06-20 MED ORDER — PROGESTERONE MICRONIZED 100 MG CAPSULE
ORAL_CAPSULE | 3 refills | 0.00000 days | Status: CP
Start: 2022-06-20 — End: ?

## 2022-06-20 NOTE — Unmapped (Signed)
Pt called left message on nurse advise line regarding refill of her progesterone. Pt states that she has appointment with [facility] on 11/9 for annual exam. Pt would like refill sent to [facility].

## 2022-07-04 NOTE — Unmapped (Signed)
Specialty Medication(s): Olumiant    Ms.Ericksen has been dis-enrolled from the [facility] specialty pharmacy services due to  no active order/patient left message with clinic she would like to stop medication. If new order received, we'll re-enroll .    Additional information provided to the patient: na    Sinclaire Artiga A Desiree Lucy [facility] Specialty Pharmacist

## 2022-07-10 ENCOUNTER — Ambulatory Visit
Admit: 2022-07-10 | Discharge: 2022-07-11 | Payer: PRIVATE HEALTH INSURANCE | Attending: Student in an Organized Health Care Education/Training Program | Primary: Student in an Organized Health Care Education/Training Program

## 2022-07-10 DIAGNOSIS — L309 Dermatitis, unspecified: Principal | ICD-10-CM

## 2022-07-10 DIAGNOSIS — J069 Acute upper respiratory infection, unspecified: Principal | ICD-10-CM

## 2022-07-10 MED ORDER — TRIAMCINOLONE ACETONIDE 0.1 % TOPICAL CREAM
Freq: Two times a day (BID) | TOPICAL | 0 refills | 21 days | Status: CP
Start: 2022-07-10 — End: 2022-07-31

## 2022-07-10 MED ORDER — PREDNISONE 20 MG TABLET
ORAL_TABLET | Freq: Every day | ORAL | 0 refills | 4 days | Status: CP
Start: 2022-07-10 — End: 2022-07-14

## 2022-07-19 ENCOUNTER — Ambulatory Visit: Admit: 2022-07-19 | Discharge: 2022-07-20 | Payer: PRIVATE HEALTH INSURANCE

## 2022-07-19 DIAGNOSIS — L2082 Flexural eczema: Principal | ICD-10-CM

## 2022-07-19 MED ORDER — HYDROXYZINE HCL 25 MG TABLET
ORAL_TABLET | Freq: Every evening | ORAL | 0 refills | 30 days | Status: CP | PRN
Start: 2022-07-19 — End: 2022-08-18

## 2022-07-19 MED ORDER — BETAMETHASONE DIPROPIONATE 0.05 % TOPICAL CREAM
Freq: Two times a day (BID) | TOPICAL | 0 refills | 0 days | Status: CP
Start: 2022-07-19 — End: 2023-07-19

## 2022-07-22 ENCOUNTER — Ambulatory Visit: Admit: 2022-07-22 | Discharge: 2022-07-23 | Payer: PRIVATE HEALTH INSURANCE

## 2022-07-22 DIAGNOSIS — L239 Allergic contact dermatitis, unspecified cause: Principal | ICD-10-CM

## 2022-07-29 DIAGNOSIS — R21 Rash and other nonspecific skin eruption: Principal | ICD-10-CM

## 2022-07-29 MED ORDER — PREDNISONE 10 MG TABLET
ORAL_TABLET | ORAL | 0 refills | 0.00000 days | Status: CP
Start: 2022-07-29 — End: ?

## 2022-07-30 DIAGNOSIS — R21 Rash and other nonspecific skin eruption: Principal | ICD-10-CM

## 2022-07-30 MED ORDER — PREDNISONE 10 MG TABLET
ORAL_TABLET | 0 refills | 0.00000 days
Start: 2022-07-30 — End: ?

## 2022-08-04 MED ORDER — PREDNISONE 10 MG TABLET
ORAL_TABLET | 0 refills | 0.00000 days
Start: 2022-08-04 — End: ?

## 2022-08-21 ENCOUNTER — Ambulatory Visit
Admit: 2022-08-21 | Discharge: 2022-08-22 | Payer: PRIVATE HEALTH INSURANCE | Attending: "Women's Health Care | Primary: "Women's Health Care

## 2022-08-22 DIAGNOSIS — L2082 Flexural eczema: Principal | ICD-10-CM

## 2022-08-22 MED ORDER — BETAMETHASONE DIPROPIONATE 0.05 % TOPICAL CREAM
0 refills | 0 days | Status: CP
Start: 2022-08-22 — End: ?

## 2022-09-02 ENCOUNTER — Ambulatory Visit: Admit: 2022-09-02 | Discharge: 2022-09-03 | Payer: PRIVATE HEALTH INSURANCE

## 2022-09-02 DIAGNOSIS — L2082 Flexural eczema: Principal | ICD-10-CM

## 2022-09-02 DIAGNOSIS — L299 Pruritus, unspecified: Principal | ICD-10-CM

## 2022-09-02 MED ORDER — BETAMETHASONE DIPROPIONATE 0.05 % TOPICAL CREAM
Freq: Two times a day (BID) | TOPICAL | 5 refills | 0.00000 days | Status: CP
Start: 2022-09-02 — End: ?

## 2022-09-02 MED ORDER — TRIAMCINOLONE ACETONIDE 0.1 % TOPICAL OINTMENT
Freq: Two times a day (BID) | TOPICAL | 0 refills | 0.00000 days | Status: CP
Start: 2022-09-02 — End: 2023-09-02

## 2022-09-03 DIAGNOSIS — G43001 Migraine without aura, not intractable, with status migrainosus: Principal | ICD-10-CM

## 2022-09-03 MED ORDER — RIZATRIPTAN 10 MG TABLET
ORAL_TABLET | Freq: Every day | ORAL | 5 refills | 9 days | Status: CP | PRN
Start: 2022-09-03 — End: ?

## 2022-09-18 DIAGNOSIS — N951 Menopausal and female climacteric states: Principal | ICD-10-CM

## 2022-09-18 MED ORDER — PROGESTERONE MICRONIZED 100 MG CAPSULE
ORAL_CAPSULE | 0 refills | 0.00000 days
Start: 2022-09-18 — End: ?

## 2022-09-19 DIAGNOSIS — N951 Menopausal and female climacteric states: Principal | ICD-10-CM

## 2022-09-19 MED ORDER — PROGESTERONE MICRONIZED 100 MG CAPSULE
ORAL_CAPSULE | 0 refills | 0.00000 days
Start: 2022-09-19 — End: ?

## 2022-09-22 DIAGNOSIS — N951 Menopausal and female climacteric states: Principal | ICD-10-CM

## 2022-09-22 MED ORDER — PROGESTERONE MICRONIZED 100 MG CAPSULE
ORAL_CAPSULE | 3 refills | 0.00000 days | Status: CP
Start: 2022-09-22 — End: ?

## 2022-09-29 ENCOUNTER — Ambulatory Visit
Admit: 2022-09-29 | Discharge: 2022-09-30 | Payer: PRIVATE HEALTH INSURANCE | Attending: Internal Medicine | Primary: Internal Medicine

## 2022-09-29 DIAGNOSIS — R7303 Prediabetes: Principal | ICD-10-CM

## 2022-09-29 DIAGNOSIS — Z6832 Body mass index (BMI) 32.0-32.9, adult: Principal | ICD-10-CM

## 2022-09-29 DIAGNOSIS — L299 Pruritus, unspecified: Principal | ICD-10-CM

## 2022-09-29 DIAGNOSIS — I10 Essential (primary) hypertension: Principal | ICD-10-CM

## 2022-09-29 DIAGNOSIS — G43001 Migraine without aura, not intractable, with status migrainosus: Principal | ICD-10-CM

## 2022-09-29 DIAGNOSIS — E6609 Other obesity due to excess calories: Principal | ICD-10-CM

## 2022-09-29 DIAGNOSIS — Z23 Encounter for immunization: Principal | ICD-10-CM

## 2022-09-29 MED ORDER — SEMAGLUTIDE (WEIGHT LOSS) 0.5 MG/0.5 ML SUBCUTANEOUS PEN INJECTOR
SUBCUTANEOUS | 0 refills | 0 days | Status: CP
Start: 2022-09-29 — End: ?

## 2022-09-29 MED ORDER — METFORMIN 500 MG TABLET
ORAL_TABLET | Freq: Two times a day (BID) | ORAL | 4 refills | 90 days | Status: CP
Start: 2022-09-29 — End: ?

## 2022-09-29 MED ORDER — SEMAGLUTIDE (WEIGHT LOSS) 0.25 MG/0.5 ML SUBCUTANEOUS PEN INJECTOR
SUBCUTANEOUS | 0 refills | 0 days | Status: CP
Start: 2022-09-29 — End: ?

## 2022-09-29 MED ORDER — SEMAGLUTIDE (WEIGHT LOSS) 1 MG/0.5 ML SUBCUTANEOUS PEN INJECTOR
SUBCUTANEOUS | 0 refills | 0 days | Status: CP
Start: 2022-09-29 — End: ?

## 2022-09-29 MED ORDER — ATENOLOL 100 MG TABLET
ORAL_TABLET | Freq: Every day | ORAL | 4 refills | 90 days | Status: CP
Start: 2022-09-29 — End: 2023-09-29

## 2022-09-29 MED ORDER — ROSUVASTATIN 20 MG TABLET
ORAL_TABLET | Freq: Every evening | ORAL | 4 refills | 90 days | Status: CP
Start: 2022-09-29 — End: 2023-09-29

## 2022-09-29 MED ORDER — SEMAGLUTIDE (WEIGHT LOSS) 1.7 MG/0.75 ML SUBCUTANEOUS PEN INJECTOR
SUBCUTANEOUS | 0 refills | 28 days | Status: CP
Start: 2022-09-29 — End: ?

## 2022-09-29 MED ORDER — RIZATRIPTAN 10 MG TABLET
ORAL_TABLET | Freq: Every day | ORAL | 12 refills | 15 days | Status: CP | PRN
Start: 2022-09-29 — End: ?

## 2022-09-29 MED ORDER — SEMAGLUTIDE (WEIGHT LOSS) 2.4 MG/0.75 ML SUBCUTANEOUS PEN INJECTOR
SUBCUTANEOUS | 0 refills | 0 days | Status: CP
Start: 2022-09-29 — End: ?

## 2022-10-01 ENCOUNTER — Ambulatory Visit: Admit: 2022-10-01 | Discharge: 2022-10-02 | Payer: PRIVATE HEALTH INSURANCE

## 2022-10-01 DIAGNOSIS — L209 Atopic dermatitis, unspecified: Principal | ICD-10-CM

## 2022-10-01 DIAGNOSIS — L639 Alopecia areata, unspecified: Principal | ICD-10-CM

## 2022-10-01 DIAGNOSIS — L281 Prurigo nodularis: Principal | ICD-10-CM

## 2022-10-01 DIAGNOSIS — L508 Other urticaria: Principal | ICD-10-CM

## 2022-10-01 MED ORDER — DUPILUMAB 300 MG/2 ML SUBCUTANEOUS PEN INJECTOR
SUBCUTANEOUS | 5 refills | 0.00000 days | Status: CP
Start: 2022-10-01 — End: ?

## 2022-10-02 DIAGNOSIS — L508 Other urticaria: Principal | ICD-10-CM

## 2022-10-02 DIAGNOSIS — L209 Atopic dermatitis, unspecified: Principal | ICD-10-CM

## 2022-10-02 DIAGNOSIS — L281 Prurigo nodularis: Principal | ICD-10-CM

## 2022-10-07 MED ORDER — OZEMPIC 0.25 MG OR 0.5 MG (2 MG/3 ML) SUBCUTANEOUS PEN INJECTOR
SUBCUTANEOUS | 0 refills | 56 days | Status: CP
Start: 2022-10-07 — End: 2022-12-02

## 2022-10-08 DIAGNOSIS — L281 Prurigo nodularis: Principal | ICD-10-CM

## 2022-10-08 DIAGNOSIS — R21 Rash and other nonspecific skin eruption: Principal | ICD-10-CM

## 2022-10-08 DIAGNOSIS — L299 Pruritus, unspecified: Principal | ICD-10-CM

## 2022-10-08 MED ORDER — GABAPENTIN 300 MG CAPSULE
ORAL_CAPSULE | 1 refills | 0 days | Status: CP
Start: 2022-10-08 — End: ?

## 2022-10-28 ENCOUNTER — Ambulatory Visit: Admit: 2022-10-28 | Discharge: 2022-10-29 | Payer: PRIVATE HEALTH INSURANCE

## 2022-10-28 DIAGNOSIS — L639 Alopecia areata, unspecified: Principal | ICD-10-CM

## 2022-10-28 DIAGNOSIS — L281 Prurigo nodularis: Principal | ICD-10-CM

## 2022-10-28 DIAGNOSIS — L299 Pruritus, unspecified: Principal | ICD-10-CM

## 2022-10-28 DIAGNOSIS — L209 Atopic dermatitis, unspecified: Principal | ICD-10-CM

## 2022-11-03 DIAGNOSIS — L281 Prurigo nodularis: Principal | ICD-10-CM

## 2022-11-03 DIAGNOSIS — L299 Pruritus, unspecified: Principal | ICD-10-CM

## 2022-11-03 DIAGNOSIS — R21 Rash and other nonspecific skin eruption: Principal | ICD-10-CM

## 2022-11-03 MED ORDER — GABAPENTIN 300 MG CAPSULE
ORAL_CAPSULE | 1 refills | 0.00000 days
Start: 2022-11-03 — End: ?

## 2022-11-04 MED ORDER — GABAPENTIN 300 MG CAPSULE
ORAL_CAPSULE | ORAL | 1 refills | 0.00000 days | Status: CP
Start: 2022-11-04 — End: ?

## 2022-11-12 ENCOUNTER — Ambulatory Visit
Admit: 2022-11-12 | Discharge: 2022-11-13 | Payer: PRIVATE HEALTH INSURANCE | Attending: Internal Medicine | Primary: Internal Medicine

## 2022-11-12 DIAGNOSIS — I10 Essential (primary) hypertension: Principal | ICD-10-CM

## 2022-11-12 MED ORDER — OLMESARTAN 20 MG TABLET
ORAL_TABLET | Freq: Every day | ORAL | 4 refills | 90 days | Status: CP
Start: 2022-11-12 — End: 2023-11-12

## 2022-11-12 MED ORDER — AMITRIPTYLINE 25 MG TABLET
ORAL_TABLET | Freq: Every evening | ORAL | 4 refills | 90 days | Status: CN
Start: 2022-11-12 — End: ?

## 2022-11-13 MED ORDER — OZEMPIC 0.25 MG OR 0.5 MG (2 MG/3 ML) SUBCUTANEOUS PEN INJECTOR
0 refills | 0 days
Start: 2022-11-13 — End: ?

## 2022-11-14 MED ORDER — OZEMPIC 0.25 MG OR 0.5 MG (2 MG/3 ML) SUBCUTANEOUS PEN INJECTOR
SUBCUTANEOUS | 0 refills | 56.00000 days
Start: 2022-11-14 — End: 2023-01-09

## 2022-11-25 DIAGNOSIS — L281 Prurigo nodularis: Principal | ICD-10-CM

## 2022-11-25 DIAGNOSIS — L209 Atopic dermatitis, unspecified: Principal | ICD-10-CM

## 2022-11-25 DIAGNOSIS — L508 Other urticaria: Principal | ICD-10-CM

## 2022-11-26 ENCOUNTER — Ambulatory Visit
Admit: 2022-11-26 | Discharge: 2022-11-27 | Payer: PRIVATE HEALTH INSURANCE | Attending: Internal Medicine | Primary: Internal Medicine

## 2022-11-26 DIAGNOSIS — G43001 Migraine without aura, not intractable, with status migrainosus: Principal | ICD-10-CM

## 2022-11-26 DIAGNOSIS — I10 Essential (primary) hypertension: Principal | ICD-10-CM

## 2022-11-26 DIAGNOSIS — E119 Type 2 diabetes mellitus without complications: Principal | ICD-10-CM

## 2022-12-02 MED ORDER — OZEMPIC 1 MG/DOSE (4 MG/3 ML) SUBCUTANEOUS PEN INJECTOR
SUBCUTANEOUS | 0 refills | 28 days | Status: CP
Start: 2022-12-02 — End: 2022-12-24

## 2022-12-08 ENCOUNTER — Ambulatory Visit
Admit: 2022-12-08 | Discharge: 2022-12-09 | Payer: PRIVATE HEALTH INSURANCE | Attending: Student in an Organized Health Care Education/Training Program | Primary: Student in an Organized Health Care Education/Training Program

## 2022-12-08 DIAGNOSIS — L299 Pruritus, unspecified: Principal | ICD-10-CM

## 2022-12-08 DIAGNOSIS — L639 Alopecia areata, unspecified: Principal | ICD-10-CM

## 2022-12-11 MED ORDER — METFORMIN ER 500 MG TABLET,EXTENDED RELEASE 24 HR
ORAL_TABLET | Freq: Two times a day (BID) | ORAL | 12 refills | 30 days | Status: CP
Start: 2022-12-11 — End: ?

## 2022-12-21 ENCOUNTER — Ambulatory Visit: Admit: 2022-12-21 | Discharge: 2022-12-22 | Payer: PRIVATE HEALTH INSURANCE

## 2022-12-21 DIAGNOSIS — R309 Painful micturition, unspecified: Principal | ICD-10-CM

## 2022-12-22 MED ORDER — METRONIDAZOLE 0.75 % (37.5 MG/5 GRAM) VAGINAL GEL
Freq: Every evening | VAGINAL | 0 refills | 5 days | Status: CP
Start: 2022-12-22 — End: 2022-12-27

## 2022-12-26 DIAGNOSIS — R109 Unspecified abdominal pain: Principal | ICD-10-CM

## 2022-12-26 DIAGNOSIS — R319 Hematuria, unspecified: Principal | ICD-10-CM

## 2022-12-30 MED ORDER — OXYCODONE 5 MG TABLET
ORAL_TABLET | ORAL | 0 refills | 2 days | Status: CP | PRN
Start: 2022-12-30 — End: ?

## 2022-12-30 MED ORDER — OZEMPIC 2 MG/DOSE (8 MG/3 ML) SUBCUTANEOUS PEN INJECTOR
SUBCUTANEOUS | 11 refills | 28 days | Status: CP
Start: 2022-12-30 — End: ?

## 2023-01-16 MED ORDER — OZEMPIC 2 MG/DOSE (8 MG/3 ML) SUBCUTANEOUS PEN INJECTOR
SUBCUTANEOUS | 11 refills | 28 days | Status: CP
Start: 2023-01-16 — End: ?

## 2023-01-16 MED ORDER — OZEMPIC 1 MG/DOSE (4 MG/3 ML) SUBCUTANEOUS PEN INJECTOR
0 refills | 0 days
Start: 2023-01-16 — End: ?

## 2023-01-19 ENCOUNTER — Ambulatory Visit
Admit: 2023-01-19 | Discharge: 2023-01-20 | Payer: PRIVATE HEALTH INSURANCE | Attending: Internal Medicine | Primary: Internal Medicine

## 2023-01-19 DIAGNOSIS — G43001 Migraine without aura, not intractable, with status migrainosus: Principal | ICD-10-CM

## 2023-01-19 DIAGNOSIS — N200 Calculus of kidney: Principal | ICD-10-CM

## 2023-01-19 DIAGNOSIS — E119 Type 2 diabetes mellitus without complications: Principal | ICD-10-CM

## 2023-01-19 DIAGNOSIS — I10 Essential (primary) hypertension: Principal | ICD-10-CM

## 2023-01-27 ENCOUNTER — Ambulatory Visit: Admit: 2023-01-27 | Discharge: 2023-01-28 | Payer: PRIVATE HEALTH INSURANCE

## 2023-03-13 ENCOUNTER — Ambulatory Visit: Admit: 2023-03-13 | Discharge: 2023-03-14 | Payer: PRIVATE HEALTH INSURANCE

## 2023-03-18 DIAGNOSIS — L281 Prurigo nodularis: Principal | ICD-10-CM

## 2023-03-18 DIAGNOSIS — L299 Pruritus, unspecified: Principal | ICD-10-CM

## 2023-03-18 DIAGNOSIS — R21 Rash and other nonspecific skin eruption: Principal | ICD-10-CM

## 2023-03-18 MED ORDER — GABAPENTIN 300 MG CAPSULE
ORAL_CAPSULE | ORAL | 1 refills | 0.00000 days | Status: CP
Start: 2023-03-18 — End: ?

## 2023-03-26 DIAGNOSIS — N898 Other specified noninflammatory disorders of vagina: Principal | ICD-10-CM

## 2023-03-26 MED ORDER — ESTRADIOL 0.01% (0.1 MG/GRAM) VAGINAL CREAM
1 refills | 0.00000 days | Status: CP
Start: 2023-03-26 — End: ?

## 2023-05-04 MED ORDER — ESCITALOPRAM 10 MG TABLET
ORAL_TABLET | Freq: Every day | ORAL | 4 refills | 0 days
Start: 2023-05-04 — End: ?

## 2023-05-05 MED ORDER — ESCITALOPRAM 10 MG TABLET
ORAL_TABLET | Freq: Every day | ORAL | 4 refills | 90 days | Status: CP
Start: 2023-05-05 — End: ?

## 2023-07-16 DIAGNOSIS — L299 Pruritus, unspecified: Principal | ICD-10-CM

## 2023-07-16 DIAGNOSIS — R21 Rash and other nonspecific skin eruption: Principal | ICD-10-CM

## 2023-07-16 DIAGNOSIS — L281 Prurigo nodularis: Principal | ICD-10-CM

## 2023-07-16 MED ORDER — GABAPENTIN 300 MG CAPSULE
ORAL_CAPSULE | ORAL | 1 refills | 0.00000 days | Status: CP
Start: 2023-07-16 — End: ?

## 2023-07-20 ENCOUNTER — Ambulatory Visit
Admit: 2023-07-20 | Discharge: 2023-07-21 | Payer: PRIVATE HEALTH INSURANCE | Attending: Internal Medicine | Primary: Internal Medicine

## 2023-07-20 DIAGNOSIS — F411 Generalized anxiety disorder: Principal | ICD-10-CM

## 2023-07-20 DIAGNOSIS — Z136 Encounter for screening for cardiovascular disorders: Principal | ICD-10-CM

## 2023-07-20 DIAGNOSIS — E119 Type 2 diabetes mellitus without complications: Principal | ICD-10-CM

## 2023-07-20 DIAGNOSIS — I10 Essential (primary) hypertension: Principal | ICD-10-CM

## 2023-07-20 MED ORDER — OLMESARTAN 20 MG TABLET
ORAL_TABLET | Freq: Every day | ORAL | 4 refills | 90 days | Status: CP
Start: 2023-07-20 — End: 2024-07-19

## 2023-09-15 DIAGNOSIS — R21 Rash and other nonspecific skin eruption: Principal | ICD-10-CM

## 2023-09-15 DIAGNOSIS — L281 Prurigo nodularis: Principal | ICD-10-CM

## 2023-09-15 DIAGNOSIS — L299 Pruritus, unspecified: Principal | ICD-10-CM

## 2023-09-15 MED ORDER — GABAPENTIN 300 MG CAPSULE
ORAL_CAPSULE | 2 refills | 0 days | Status: CP
Start: 2023-09-15 — End: ?

## 2023-09-22 DIAGNOSIS — L299 Pruritus, unspecified: Principal | ICD-10-CM

## 2023-09-23 DIAGNOSIS — L299 Pruritus, unspecified: Principal | ICD-10-CM

## 2023-09-23 MED ORDER — HYDROXYZINE HCL 25 MG TABLET
ORAL_TABLET | 0 refills | 0.00 days | Status: CP
Start: 2023-09-23 — End: ?

## 2023-09-24 DIAGNOSIS — N951 Menopausal and female climacteric states: Principal | ICD-10-CM

## 2023-09-24 MED ORDER — PROGESTERONE MICRONIZED 100 MG CAPSULE
ORAL_CAPSULE | 0 refills | 0.00 days | Status: CP
Start: 2023-09-24 — End: ?

## 2023-09-29 ENCOUNTER — Ambulatory Visit: Admit: 2023-09-29 | Discharge: 2023-09-30 | Payer: BLUE CROSS/BLUE SHIELD

## 2023-09-29 DIAGNOSIS — L508 Other urticaria: Principal | ICD-10-CM

## 2023-09-29 DIAGNOSIS — L639 Alopecia areata, unspecified: Principal | ICD-10-CM

## 2023-09-29 DIAGNOSIS — L281 Prurigo nodularis: Principal | ICD-10-CM

## 2023-09-29 DIAGNOSIS — L209 Atopic dermatitis, unspecified: Principal | ICD-10-CM

## 2023-09-29 MED ORDER — DUPIXENT 300 MG/2 ML SUBCUTANEOUS PEN INJECTOR
SUBCUTANEOUS | 5 refills | 0.00 days | Status: CP
Start: 2023-09-29 — End: 2023-09-29
  Filled 2023-10-16: qty 4, 14d supply, fill #0

## 2023-10-01 DIAGNOSIS — L281 Prurigo nodularis: Principal | ICD-10-CM

## 2023-10-01 DIAGNOSIS — L508 Other urticaria: Principal | ICD-10-CM

## 2023-10-01 DIAGNOSIS — L209 Atopic dermatitis, unspecified: Principal | ICD-10-CM

## 2023-10-02 MED ORDER — ROSUVASTATIN 20 MG TABLET
ORAL_TABLET | 4 refills | 0.00 days | Status: CP
Start: 2023-10-02 — End: ?

## 2023-10-14 NOTE — Unmapped (Signed)
[facility] Specialty Medication Onboarding    Specialty Medication: DUPIXENT PEN 300 mg/2 mL Pnij (dupilumab)  Prior Authorization: Approved   Financial Assistance: Yes - copay card approved as secondary   Final Copay/Day Supply: $0 / 14    Insurance Restrictions: None     Notes to Pharmacist: LD  Credit Card on File: not applicable    The triage team has completed the benefits investigation and has determined that the patient is able to fill this medication at [facility]. Please contact the patient to complete the onboarding or follow up with the prescribing physician as needed.[facility] Specialty Medication Onboarding    Specialty Medication: DUPIXENT PEN 300 mg/2 mL Pnij (dupilumab)  Prior Authorization: Approved   Financial Assistance: Yes - copay card approved as secondary   Final Copay/Day Supply: $0 / 28    Insurance Restrictions: None     Notes to Pharmacist: MD  Credit Card on File: not applicable    The triage team has completed the benefits investigation and has determined that the patient is able to fill this medication at [facility]. Please contact the patient to complete the onboarding or follow up with the prescribing physician as needed.

## 2023-10-15 MED ORDER — EMPTY CONTAINER
2 refills | 0.00 days
Start: 2023-10-15 — End: ?

## 2023-10-15 NOTE — Unmapped (Signed)
I reviewed this patient case and all documentation provided by the learner and was readily available for consultation during their interaction with the patient.  I agree with the assessment and plan listed below.    Kabe Mckoy A Desiree [facility] Specialty Pharmacist

## 2023-10-15 NOTE — Unmapped (Deleted)
[facility]    Patient Onboarding/Medication Counseling    Bonnie Harvey is a 58 y.o. 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  I am speaking to the patient.    Was a Nurse, learning disability used for this call? No    Verified patient's date of birth / HIPAA.    Specialty medication(s) to be sent: Inflammatory Disorders: Dupixent      Non-specialty medications/supplies to be sent: Sharps kit (sharps container, alcohol wipes)      Medications not needed at this time: None       Dupixent (dupilumab)    Medication & Administration     Dosage: Atopic dermatitis: Inject 600mg  under the skin as a loading dose followed by 300mg  every 14 days thereafter    Administration:     Dupixent Pen  1. Gather all supplies needed for injection on a clean, flat working surface: medication syringe removed from packaging, alcohol swab, sharps container, etc.  2. Look at the medication label - look for correct medication, correct dose, and check the expiration date  3. Look at the medication - the liquid in the pen should appear clear and colorless to pale yellow  4. Lay the pen on a flat surface and allow it to warm up to room temperature for at least 45 minutes  5. Select injection site - you can use the front of your thigh or your belly (but not the area 2 inches around your belly button); if someone else is giving you the injection you can also use your upper arm in the skin covering your triceps muscle  6. Prepare injection site - wash your hands and clean the skin at the injection site with an alcohol swab and let it air dry, do not touch the injection site again before the injection  7. Hold the middle of the body of the pen and gently pull the needle safety cap straight out. Be careful not to bend the needle. Do not remove until immediately prior to injection  8. Press the pen down onto the injection site at a 90 degree angle.   9. You will hear a click as the injection starts, and then a second click when the injection is ALMOST done. Keep holding the pen against the skin for 5 more seconds after the second click.   10. Check that the pen is empty by looking in the viewing window - the yellow indicator bar should be stopped, and should fill the window.   11. Remove the pen from the skin by lifting straight up.   12. Dispose of the used pen immediately in your sharps disposal container  13. If you see any blood at the injection site, press a cotton ball or gauze on the site and maintain pressure until the bleeding stops, do not rub the injection site    Adherence/Missed dose instructions:  If a dose is missed, administer within 7 days from the missed dose and then resume the original schedule. If the missed dose is not administered within 7 days, you can either wait until the next dose on the original schedule or take your dose now and resume every 14 days from the new injection date. Do not use 2 doses at the same time or extra doses.      Goals of Therapy     -Reduce symptoms of pruritus and dermatitis  -Prevent exacerbations  -  Minimize therapeutic risks  -Avoidance of long-term systemic and topical glucocorticoid use  -Maintenance of effective psychosocial functioning    Side Effects & Monitoring Parameters     Injection site reaction (redness, irritation, inflammation localized to the site of administration)  Signs of a common cold - minor sore throat, runny or stuffy nose, etc.  Recurrence of cold sores (herpes simplex)      The following side effects should be reported to the provider:  Signs of a hypersensitivity reaction - rash; hives; itching; red, swollen, blistered, or peeling skin; wheezing; tightness in the chest or throat; difficulty breathing, swallowing, or talking; swelling of the mouth, face, lips, tongue, or throat; etc.  Eye pain or irritation or any visual disturbances  Shortness of breath or worsening of breathing      Contraindications, Warnings, & Precautions     Have your bloodwork checked as you have been told by your prescriber   Birth control pills and other hormone-based birth control may not work as well to prevent pregnancy  Talk with your doctor if you are pregnant, planning to become pregnant, or breastfeeding  Discuss the possible need for holding your dose(s) of Dupixent?? when a planned procedure is scheduled with the prescriber as it may delay healing/recovery timeline       Drug/Food Interactions     Medication list reviewed in Epic. The patient was instructed to inform the care team before taking any new medications or supplements. No drug interactions identified.   Talk with you prescriber or pharmacist before receiving any live vaccinations while taking this medication and after you stop taking it    Storage, Handling Precautions, & Disposal     Store this medication in the refrigerator.  Do not freeze  If needed, you may store at room temperature for up to 14 days  Store in original packaging, protected from light  Do not shake  Dispose of used syringes in a sharps disposal container        Current Medications (including OTC/herbals), Comorbidities and Allergies     Current Outpatient Medications   Medication Sig Dispense Refill    dupilumab (DUPIXENT PEN) 300 mg/2 mL PnIj Inject the contents of 1 pen (300 mg) under the skin every fourteen (14) days. Maintenance 4 mL 5    escitalopram oxalate (LEXAPRO) 10 MG tablet TAKE 1 TABLET BY MOUTH ONCE DAILY (Patient taking differently: Take 0.5 tablets (5 mg total) by mouth daily.) 90 tablet 4    fexofenadine HCl (ALLEGRA ORAL) Take 180 mg by mouth in the morning.      fluticasone (FLONASE) 50 mcg/actuation nasal spray 2 sprays by Each Nare route daily. 16 g 11    gabapentin (NEURONTIN) 300 MG capsule TAKE 1 CAPSULE BY MOUTH TWICE DAILY 60 capsule 2    hydrOXYzine (ATARAX) 25 MG tablet Take one tablet up to twice daily as needed for itch. 60 tablet 0    naproxen (NAPROSYN) 500 MG tablet 1 tab As needed for onset of migraine 20 tablet 11    olmesartan (BENICAR) 20 MG tablet Take 0.5 tablets (10 mg total) by mouth daily. 10 mg per night ( half Tablet ) per PCP 45 tablet 4    progesterone (PROMETRIUM) 100 MG capsule TAKE 1 CAPSULE BY MOUTH AT BEDTIME. STOPPING FOR 5 DAYS WITH MENSTRUAL PERIODS 90 capsule 3    rizatriptan (MAXALT) 10 MG tablet Take 1 tablet (10 mg total) by mouth daily as needed for migraine. May repeat in 2 hours if  needed 15 tablet 12    rosuvastatin (CRESTOR) 20 MG tablet TAKE 1 TABLET BY MOUTH ONCE EVERY EVENING 90 tablet 4    semaglutide (OZEMPIC) 2 mg/dose (8 mg/3 mL) PnIj Inject 2 mg under the skin every seven (7) days. 3 mL 11     No current facility-administered medications for this visit.       Allergies   Allergen Reactions    Shellfish Containing Products Nausea And Vomiting    Sulfa (Sulfonamide Antibiotics) Rash       Patient Active Problem List   Diagnosis    Generalized anxiety disorder    Eczema    Migraine without aura and with status migrainosus, not intractable    Type 2 diabetes mellitus without complication, without long-term current use of insulin (CMS-HCC)    Viral URI with cough    Class 1 obesity due to excess calories with serious comorbidity and body mass index (BMI) of 32.0 to 32.9 in adult    Primary hypertension    Nephrolithiasis     Reviewed and up to date in Epic.    Appropriateness of Therapy     Acute infections noted within Epic:  No active infections  Patient reported infection: None    Is the medication and dose appropriate based on diagnosis, medication list, comorbidities, allergies, medical history, patient???s ability to self-administer the medication, and therapeutic goals? Yes    Prescription has been clinically reviewed: Yes      Baseline Quality of Life Assessment      How many days over the past month did your pruritus, atopic dermatitis, and chronic urticaria  keep you from your normal activities? For example, brushing your teeth or getting up in the morning. Patient declined to answer    Financial Information     Medication Assistance provided: Prior Authorization and Copay Assistance    Anticipated copay of $0 reviewed with patient. Verified delivery address.    Delivery Information     Scheduled delivery date: Loading dose: 10/16/2023, (Maintenance dose delivery scheduled for 10/29/2023)    Expected start date: Loading dose: 10/17/2023, (maintenance dose scheduled for 10/30/2023)    Medication will be delivered via Same Day Courier to the prescription address in [facility].  This shipment will not require a signature.      Explained the services we provide at [facility] and that each month we would call to set up refills.  Stressed importance of returning phone calls so that we could ensure they receive their medications in time each month.  Informed patient that we should be setting up refills 7-10 days prior to when they will run out of medication.  A pharmacist will reach out to perform a clinical assessment periodically.  Informed patient that a welcome packet, containing information about our pharmacy and other support services, a Notice of Privacy Practices, and a drug information handout will be sent.      The patient or caregiver noted above participated in the development of this care plan and knows that they can request review of or adjustments to the care plan at any time.      Patient or caregiver verbalized understanding of the above information as well as how to contact the pharmacy at [phone] option 4 with any questions/concerns.  The pharmacy is open Monday through Friday 8:30am-4:30pm.  A pharmacist is available 24/7 via pager to answer any clinical questions they may have.    Patient Specific Needs  Does the patient have any physical, cognitive, or cultural barriers? No    Does the patient have adequate living arrangements? (i.e. the ability to store and take their medication appropriately) Yes    Did you identify any home environmental safety or security hazards? No    Patient prefers to have medications discussed with  Patient     Is the patient or caregiver able to read and understand education materials at a high school level or above? Yes    Patient's primary language is  English     Is the patient high risk? No    SOCIAL DETERMINANTS OF HEALTH     At the [facility], we have learned that life circumstances - like trouble affording food, housing, utilities, or transportation can affect the health of many of our patients.   That is why we wanted to ask: are you currently experiencing any life circumstances that are negatively impacting your health and/or quality of life? Patient declined to answer    Social Drivers of Health     Food Insecurity: Not on file   Internet Connectivity: Not on file   Housing/Utilities: Not on file   Tobacco Use: Medium Risk (09/29/2023)    Patient History     Smoking Tobacco Use: Former     Smokeless Tobacco Use: Never     Passive Exposure: Past   Transportation Needs: No Transportation Needs (11/12/2022)    PRAPARE - Therapist, art (Medical): No     Lack of Transportation (Non-Medical): No   Alcohol Use: Not At Risk (06/04/2021)    Alcohol Use     How often do you have a drink containing alcohol?: Monthly or less     How many drinks containing alcohol do you have on a typical day when you are drinking?: 1 - 2     How often do you have 5 or more drinks on one occasion?: Never   Interpersonal Safety: Not on file   Physical Activity: Not on file   Intimate Partner Violence: Not At Risk (09/29/2022)    Humiliation, Afraid, Rape, and Kick questionnaire     Fear of Current or Ex-Partner: No     Emotionally Abused: No     Physically Abused: No     Sexually Abused: No   Stress: Not on file   Substance Use: Not on file (08/19/2023)   Social Connections: Not on file   Financial Resource Strain: Not on file   Depression: Not at risk (07/10/2022)    PHQ-2     PHQ-2 Score: 0   Health Literacy: Not on file       Would you be willing to receive help with any of the needs that you have identified today? Not applicable       Aldean Jewett, PharmD Candidate      [facility] Specialty Pharmacist

## 2023-10-15 NOTE — Unmapped (Signed)
[facility]    Patient Onboarding/Medication Counseling    Bonnie Harvey is a 58 y.o. 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  I am speaking to the patient.    Was a Nurse, learning disability used for this call? No    Verified patient's date of birth / HIPAA.    Specialty medication(s) to be sent: Inflammatory Disorders: Dupixent      Non-specialty medications/supplies to be sent: Sharps kit (sharps container, alcohol wipes)      Medications not needed at this time: None       Dupixent (dupilumab)    Medication & Administration     Dosage: Atopic dermatitis: Inject 600mg  under the skin as a loading dose followed by 300mg  every 14 days thereafter    Administration:     Dupixent Pen  1. Gather all supplies needed for injection on a clean, flat working surface: medication syringe removed from packaging, alcohol swab, sharps container, etc.  2. Look at the medication label - look for correct medication, correct dose, and check the expiration date  3. Look at the medication - the liquid in the pen should appear clear and colorless to pale yellow  4. Lay the pen on a flat surface and allow it to warm up to room temperature for at least 45 minutes  5. Select injection site - you can use the front of your thigh or your belly (but not the area 2 inches around your belly button); if someone else is giving you the injection you can also use your upper arm in the skin covering your triceps muscle  6. Prepare injection site - wash your hands and clean the skin at the injection site with an alcohol swab and let it air dry, do not touch the injection site again before the injection  7. Hold the middle of the body of the pen and gently pull the needle safety cap straight out. Be careful not to bend the needle. Do not remove until immediately prior to injection  8. Press the pen down onto the injection site at a 90 degree angle.   9. You will hear a click as the injection starts, and then a second click when the injection is ALMOST done. Keep holding the pen against the skin for 5 more seconds after the second click.   10. Check that the pen is empty by looking in the viewing window - the yellow indicator bar should be stopped, and should fill the window.   11. Remove the pen from the skin by lifting straight up.   12. Dispose of the used pen immediately in your sharps disposal container  13. If you see any blood at the injection site, press a cotton ball or gauze on the site and maintain pressure until the bleeding stops, do not rub the injection site    Adherence/Missed dose instructions:  If a dose is missed, administer within 7 days from the missed dose and then resume the original schedule. If the missed dose is not administered within 7 days, you can either wait until the next dose on the original schedule or take your dose now and resume every 14 days from the new injection date. Do not use 2 doses at the same time or extra doses.      Goals of Therapy     -Reduce symptoms of pruritus and dermatitis  -Prevent exacerbations  -  Minimize therapeutic risks  -Avoidance of long-term systemic and topical glucocorticoid use  -Maintenance of effective psychosocial functioning    Side Effects & Monitoring Parameters     Injection site reaction (redness, irritation, inflammation localized to the site of administration)  Signs of a common cold - minor sore throat, runny or stuffy nose, etc.  Recurrence of cold sores (herpes simplex)      The following side effects should be reported to the provider:  Signs of a hypersensitivity reaction - rash; hives; itching; red, swollen, blistered, or peeling skin; wheezing; tightness in the chest or throat; difficulty breathing, swallowing, or talking; swelling of the mouth, face, lips, tongue, or throat; etc.  Eye pain or irritation or any visual disturbances  Shortness of breath or worsening of breathing      Contraindications, Warnings, & Precautions     Have your bloodwork checked as you have been told by your prescriber   Birth control pills and other hormone-based birth control may not work as well to prevent pregnancy  Talk with your doctor if you are pregnant, planning to become pregnant, or breastfeeding  Discuss the possible need for holding your dose(s) of Dupixent?? when a planned procedure is scheduled with the prescriber as it may delay healing/recovery timeline       Drug/Food Interactions     Medication list reviewed in Epic. The patient was instructed to inform the care team before taking any new medications or supplements. No drug interactions identified.   Talk with you prescriber or pharmacist before receiving any live vaccinations while taking this medication and after you stop taking it    Storage, Handling Precautions, & Disposal     Store this medication in the refrigerator.  Do not freeze  If needed, you may store at room temperature for up to 14 days  Store in original packaging, protected from light  Do not shake  Dispose of used syringes in a sharps disposal container        Current Medications (including OTC/herbals), Comorbidities and Allergies     Current Outpatient Medications   Medication Sig Dispense Refill    dupilumab (DUPIXENT PEN) 300 mg/2 mL PnIj Inject the contents of 2 pens (600 mg) under the skin once for 1 dose. Loading dose 4 mL 0    dupilumab (DUPIXENT PEN) 300 mg/2 mL PnIj Inject the contents of 1 pen (300 mg) under the skin every fourteen (14) days. Maintenance 4 mL 5    escitalopram oxalate (LEXAPRO) 10 MG tablet TAKE 1 TABLET BY MOUTH ONCE DAILY (Patient taking differently: Take 0.5 tablets (5 mg total) by mouth daily.) 90 tablet 4    fexofenadine HCl (ALLEGRA ORAL) Take 180 mg by mouth in the morning.      fluticasone (FLONASE) 50 mcg/actuation nasal spray 2 sprays by Each Nare route daily. 16 g 11    gabapentin (NEURONTIN) 300 MG capsule TAKE 1 CAPSULE BY MOUTH TWICE DAILY 60 capsule 2    hydrOXYzine (ATARAX) 25 MG tablet Take one tablet up to twice daily as needed for itch. 60 tablet 0    naproxen (NAPROSYN) 500 MG tablet 1 tab As needed for onset of migraine 20 tablet 11    olmesartan (BENICAR) 20 MG tablet Take 0.5 tablets (10 mg total) by mouth daily. 10 mg per night ( half Tablet ) per PCP 45 tablet 4    progesterone (PROMETRIUM) 100 MG capsule TAKE 1 CAPSULE BY MOUTH AT BEDTIME. STOPPING FOR 5 DAYS WITH MENSTRUAL PERIODS  90 capsule 3    rizatriptan (MAXALT) 10 MG tablet Take 1 tablet (10 mg total) by mouth daily as needed for migraine. May repeat in 2 hours if needed 15 tablet 12    rosuvastatin (CRESTOR) 20 MG tablet TAKE 1 TABLET BY MOUTH ONCE EVERY EVENING 90 tablet 4    semaglutide (OZEMPIC) 2 mg/dose (8 mg/3 mL) PnIj Inject 2 mg under the skin every seven (7) days. 3 mL 11     No current facility-administered medications for this visit.       Allergies   Allergen Reactions    Shellfish Containing Products Nausea And Vomiting    Sulfa (Sulfonamide Antibiotics) Rash       Patient Active Problem List   Diagnosis    Generalized anxiety disorder    Eczema    Migraine without aura and with status migrainosus, not intractable    Type 2 diabetes mellitus without complication, without long-term current use of insulin (CMS-HCC)    Viral URI with cough    Class 1 obesity due to excess calories with serious comorbidity and body mass index (BMI) of 32.0 to 32.9 in adult    Primary hypertension    Nephrolithiasis     Reviewed and up to date in Epic.    Appropriateness of Therapy     Acute infections noted within Epic:  No active infections  Patient reported infection: None    Is the medication and dose appropriate based on diagnosis, medication list, comorbidities, allergies, medical history, patient???s ability to self-administer the medication, and therapeutic goals? Yes    Prescription has been clinically reviewed: Yes      Baseline Quality of Life Assessment      How many days over the past month did your pruritus, atopic dermatitis, and chronic urticaria  keep you from your normal activities? For example, brushing your teeth or getting up in the morning. Patient declined to answer    Financial Information     Medication Assistance provided: Prior Authorization and Copay Assistance    Anticipated copay of $0 reviewed with patient. Verified delivery address.    Delivery Information     Scheduled delivery date: Loading dose: 10/16/2023, (Maintenance dose delivery scheduled for 10/29/2023)    Expected start date: Loading dose: 10/17/2023, (maintenance dose scheduled for 10/30/2023)    Medication will be delivered via Same Day Courier to the prescription address in [facility].  This shipment will not require a signature.      Explained the services we provide at [facility] and that each month we would call to set up refills.  Stressed importance of returning phone calls so that we could ensure they receive their medications in time each month.  Informed patient that we should be setting up refills 7-10 days prior to when they will run out of medication.  A pharmacist will reach out to perform a clinical assessment periodically.  Informed patient that a welcome packet, containing information about our pharmacy and other support services, a Notice of Privacy Practices, and a drug information handout will be sent.      The patient or caregiver noted above participated in the development of this care plan and knows that they can request review of or adjustments to the care plan at any time.      Patient or caregiver verbalized understanding of the above information as well as how to contact the pharmacy at [phone] option 4 with any questions/concerns.  The pharmacy is  open Monday through Friday 8:30am-4:30pm.  A pharmacist is available 24/7 via pager to answer any clinical questions they may have.    Patient Specific Needs Does the patient have any physical, cognitive, or cultural barriers? No    Does the patient have adequate living arrangements? (i.e. the ability to store and take their medication appropriately) Yes    Did you identify any home environmental safety or security hazards? No    Patient prefers to have medications discussed with  Patient     Is the patient or caregiver able to read and understand education materials at a high school level or above? Yes    Patient's primary language is  English     Is the patient high risk? No    SOCIAL DETERMINANTS OF HEALTH     At the [facility], we have learned that life circumstances - like trouble affording food, housing, utilities, or transportation can affect the health of many of our patients.   That is why we wanted to ask: are you currently experiencing any life circumstances that are negatively impacting your health and/or quality of life? Patient declined to answer    Social Drivers of Health     Food Insecurity: Not on file   Internet Connectivity: Not on file   Housing/Utilities: Not on file   Tobacco Use: Medium Risk (09/29/2023)    Patient History     Smoking Tobacco Use: Former     Smokeless Tobacco Use: Never     Passive Exposure: Past   Transportation Needs: No Transportation Needs (11/12/2022)    PRAPARE - Therapist, art (Medical): No     Lack of Transportation (Non-Medical): No   Alcohol Use: Not At Risk (06/04/2021)    Alcohol Use     How often do you have a drink containing alcohol?: Monthly or less     How many drinks containing alcohol do you have on a typical day when you are drinking?: 1 - 2     How often do you have 5 or more drinks on one occasion?: Never   Interpersonal Safety: Not on file   Physical Activity: Not on file   Intimate Partner Violence: Not At Risk (09/29/2022)    Humiliation, Afraid, Rape, and Kick questionnaire     Fear of Current or Ex-Partner: No     Emotionally Abused: No     Physically Abused: No Sexually Abused: No   Stress: Not on file   Substance Use: Not on file (08/19/2023)   Social Connections: Not on file   Financial Resource Strain: Not on file   Depression: Not at risk (07/10/2022)    PHQ-2     PHQ-2 Score: 0   Health Literacy: Not on file       Would you be willing to receive help with any of the needs that you have identified today? Not applicable       Aldean Jewett, PharmD Candidate      [facility] Specialty Pharmacist

## 2023-10-16 MED FILL — EMPTY CONTAINER: 120 days supply | Qty: 1 | Fill #0

## 2023-10-21 NOTE — Unmapped (Signed)
[facility] Clinical Intervention    Type of intervention: Medication administration    Medication involved: Dupxent     Problem identified: Patient called in for injection assistance for first dose.     Intervention performed: We reviewed dosing and she performed loading doses. Some amount of liquid remained on her skin post-injection. We reviewed that next time she can pinch an inch of skin and inject into that area. That may help create a better seal/prevent future leakage.     Follow-up needed: NA - patient will call back if she has additional injection questions.    Approximate time spent: >20 minutes    Clinical evidence used to support intervention: Drug information resource    Result of the intervention: Improved medication adherence    Yui Mulvaney [facility] Specialty Pharmacist

## 2023-10-29 MED FILL — DUPIXENT 300 MG/2 ML SUBCUTANEOUS PEN INJECTOR: SUBCUTANEOUS | 28 days supply | Qty: 4 | Fill #0

## 2023-11-04 NOTE — Unmapped (Signed)
[facility] Clinical Intervention    Type of intervention: Medication administration    Medication involved: Dupixent    Problem identified: Patient called for injection assistance with next dose.    Intervention performed: I walked through injection technique with her. She was able to give dose successfully and had no pooling of liquid with this dose.     Follow-up needed: na    Approximate time spent: 5-10 minutes    Clinical evidence used to support intervention: Professional judgement    Result of the intervention: Improved therapy effectiveness    Raissa Dam [facility] Specialty Pharmacist

## 2023-11-06 NOTE — Unmapped (Signed)
Copied from CRM #[id number]. Topic: Access To Clinicians - Req Clinic Call Back  >> Nov 06, 2023  9:42 AM Bonnie Harvey wrote:  The patient called requesting: regarding her ozempic. She stated their ins changed and she cannot afford it now. She was due for her injection on Tuesday. Please advise.         Preferred Contact: Cell Phone Routine callback turnaround time: 24-48 business hours. Programmer, systems Notified)

## 2023-11-06 NOTE — Unmapped (Signed)
PA for Ozempic started with [id number]

## 2023-11-07 NOTE — Unmapped (Signed)
Called and spoke with Tobi Bastos about Ozempic PA. PA was already on file per covermymeds. Also told her that I called and spoke with the pharmacist at [facility] (preferred pharmacy). Pharmacist stated that she is paying a higher copay due to not meeting her deductible with a new insurance. Instructed her to get in touch with her insurance company to understand how much more of the deductible she has left and if they will cover any other medications similar to Ozempic. Bolu[place]ife voiced understanding and stated that she will reach back out to the clinic with the insurance's reply.

## 2023-11-11 MED ORDER — SEMAGLUTIDE 7 MG TABLET
ORAL_TABLET | Freq: Every day | ORAL | 4 refills | 0.00 days | Status: CP
Start: 2023-11-11 — End: ?

## 2023-11-14 DIAGNOSIS — G43001 Migraine without aura, not intractable, with status migrainosus: Principal | ICD-10-CM

## 2023-11-14 MED ORDER — RIZATRIPTAN 10 MG TABLET
ORAL_TABLET | 12 refills | 0.00 days
Start: 2023-11-14 — End: ?

## 2023-11-16 MED ORDER — RIZATRIPTAN 10 MG TABLET
ORAL_TABLET | 12 refills | 0.00 days | Status: CP
Start: 2023-11-16 — End: ?

## 2023-11-17 ENCOUNTER — Ambulatory Visit
Admit: 2023-11-17 | Discharge: 2023-11-18 | Payer: BLUE CROSS/BLUE SHIELD | Attending: "Women's Health Care | Primary: "Women's Health Care

## 2023-11-17 DIAGNOSIS — N951 Menopausal and female climacteric states: Principal | ICD-10-CM

## 2023-11-17 DIAGNOSIS — Z780 Asymptomatic menopausal state: Principal | ICD-10-CM

## 2023-11-17 MED ORDER — PROGESTERONE MICRONIZED 100 MG CAPSULE
ORAL_CAPSULE | Freq: Every day | ORAL | 3 refills | 90.00000 days | Status: CP
Start: 2023-11-17 — End: ?

## 2023-11-17 MED ORDER — ESTRADIOL 0.025 MG/24 HR WEEKLY TRANSDERMAL PATCH
MEDICATED_PATCH | TRANSDERMAL | 3 refills | 84.00000 days | Status: CP
Start: 2023-11-17 — End: 2024-11-16

## 2023-11-17 MED ORDER — ESTRADIOL 10 MCG VAGINAL TABLET
ORAL_TABLET | 8 refills | 0.00000 days | Status: CP
Start: 2023-11-17 — End: ?

## 2023-11-17 NOTE — Unmapped (Signed)
Outpatient Gynecology Note - Established Patient    ASSESSMENT AND PLAN     Problem List Items Addressed This Visit    None  Visit Diagnoses         Vaginal dryness, menopausal    -  Primary    Relevant Medications    estradiol (VAGIFEM) 10 mcg vaginal tablet      Menopause        Relevant Medications    estradiol (CLIMARA) 0.025 mg/24 hr    progesterone (PROMETRIUM) 100 MG capsule          Risk/Benefits discussed:  Hormone therapy risks: Many risks extrapolated from [facility] which may not be applicable as it enrolled older pts and only studied one formulation and dose. Based on this data, risk of stroke, heart disease, blood clot, breast cancer were slightly higher (10/998 users) in estrogen/progestogen group. In the estrogen only arm, the only increased risk was blood clot so clearly showing some protective benefit as well. In both arms, hormones did also decrease risk of osteoporosis, diabetes, colorectal cancer.            Transdermal estradiol (like the patch) in observational studies has no increased risk of blood clot. Which is why I normally prefer this.            The concern with breast cancer risk was related to possibly the progestogen in this trial, only occurred after 5 years in the combined hormone arm. When they've re-analyzed the data, it may be due to the low incidence in the placebo arm rather than the treatment itself. Other observational studies have shown that synthetic progestins may be more of a risk but that progesterone (prometrium) (which I typically recommend) is not.      So,there may be a small increased risk with hormones but it's low.    Pt may expect slight spotting initially.  After that if having bleeding, will let us know.    Continue annual mammograms.  Aware that it will take 2-3 months of regular use to see symptom relief.          Return in about 3 months (around 02/14/2024).    Bonnie Harvey, [facility]    SUBJECTIVE     This 58 y.o. [id number] is an established [facility] patient who presents for a problem visit.    HPI:  Reports her last period was August 2023.  Having hot flashes, brain fog, sleep disruptions.  Would like to start a Estrogen patch.  Using Progesterone, thought it was causing a rash, and found that it actually was not.  Has vaginal dryness.  Not using estradiol vaginal cream because it was too messy.    GYN History:   Menstrual cycles: amenorrheic- last 05/2022  Last Pap:  07/2021 NILM HPV neg        Past medical and surgical history was reviewed and updated.    Current Outpatient Medications   Medication Sig Dispense Refill    dupilumab (DUPIXENT PEN) 300 mg/2 mL PnIj Inject the contents of 1 pen (300 mg) under the skin every fourteen (14) days. Maintenance 4 mL 5    empty container Misc Use as directed to dispose of Dupixent pens. 1 each 2    escitalopram oxalate (LEXAPRO) 10 MG tablet TAKE 1 TABLET BY MOUTH ONCE DAILY (Patient taking differently: Take 0.5 tablets (5 mg total) by mouth daily.) 90 tablet 4    fexofenadine HCl (ALLEGRA ORAL) Take 180 mg by mouth in the morning.  gabapentin (NEURONTIN) 300 MG capsule TAKE 1 CAPSULE BY MOUTH TWICE DAILY 60 capsule 2    hydrOXYzine (ATARAX) 25 MG tablet Take one tablet up to twice daily as needed for itch. 60 tablet 0    naproxen (NAPROSYN) 500 MG tablet 1 tab As needed for onset of migraine 20 tablet 11    olmesartan (BENICAR) 20 MG tablet Take 0.5 tablets (10 mg total) by mouth daily. 10 mg per night ( half Tablet ) per PCP 45 tablet 4    rizatriptan (MAXALT) 10 MG tablet TAKE 1 TABLET BY MOUTH FOR MIGRAINE. MAY REPEAT IN 2 HOURS. USE NO MORE THAN 2 TABS/24 HOURS OR 2-3 DAYS/WEEK. 15 tablet 12    rosuvastatin (CRESTOR) 20 MG tablet TAKE 1 TABLET BY MOUTH ONCE EVERY EVENING 90 tablet 4    semaglutide 7 mg Tab Take 7 mg by mouth daily. 90 tablet 4    estradiol (CLIMARA) 0.025 mg/24 hr Place 1 patch on the skin every seven (7) days. 12 patch 3    estradiol (VAGIFEM) 10 mcg vaginal tablet Use one tab in vagina nightly for 2 weeks, then twice weekly 18 tablet 8    fluticasone (FLONASE) 50 mcg/actuation nasal spray 2 sprays by Each Nare route daily. 16 g 11    progesterone (PROMETRIUM) 100 MG capsule Take 1 capsule (100 mg total) by mouth daily. 90 capsule 3     No current facility-administered medications for this visit.       Allergies   Allergen Reactions    Shellfish Containing Products Nausea And Vomiting    Sulfa (Sulfonamide Antibiotics) Rash       REVIEW OF SYSTEMS: See HPI; remaining balance of 10 systems are negative/non-contributory.    OBJECTIVE     BP 123/79  - Pulse 63  - Wt 64.7 kg (142 lb 9.6 oz)  - LMP 04/12/2022 (Approximate) Comment: last period 9-10 months prior - BMI 26.94 kg/m??   General: well-appearing and in NAD  Psych: pleasant and interactive, answers questions appropriately    A chaperone was present for any sensitive portions of the exam (if performed) including but not limited to, breast and pelvic examination.  If performed, consent was obtained from patient prior to the sensitive examination.  If medical students were involved, explicit additional consent was obtained.     LABS     Results for orders placed or performed in visit on 07/24/23   HM DIABETES EYE EXAM   Result Value Ref Range    HM Diabetic Eye Exam Negative Retinopathy - Both Eyes Negative Retinopathy - Both Eyes, Negative Retinopathy - Right Eye, Negative Retinopathy - Left Eye

## 2023-11-17 NOTE — Unmapped (Signed)
Bonnie Harvey reports her skin hasn't changed much - not worse maybe slightly better. No new flaring but old areas haven't resolved.    She initially had diarrhea but none after last dose. Had with load. No eye irritation.       [facility] Clinical Assessment & Refill Coordination Note    Bonnie Harvey, DOB: 10/15/1965  Phone: [phone] (home)     All above HIPAA information was verified with patient.     Was a Nurse, learning disability used for this call? No    Specialty Medication(s):   Inflammatory Disorders: Dupixent     Current Outpatient Medications   Medication Sig Dispense Refill    dupilumab (DUPIXENT PEN) 300 mg/2 mL PnIj Inject the contents of 1 pen (300 mg) under the skin every fourteen (14) days. Maintenance 4 mL 5    empty container Misc Use as directed to dispose of Dupixent pens. 1 each 2    escitalopram oxalate (LEXAPRO) 10 MG tablet TAKE 1 TABLET BY MOUTH ONCE DAILY (Patient taking differently: Take 0.5 tablets (5 mg total) by mouth daily.) 90 tablet 4    estradiol (CLIMARA) 0.025 mg/24 hr Place 1 patch on the skin every seven (7) days. 12 patch 3    estradiol (VAGIFEM) 10 mcg vaginal tablet Use one tab in vagina nightly for 2 weeks, then twice weekly 18 tablet 8    fexofenadine HCl (ALLEGRA ORAL) Take 180 mg by mouth in the morning.      fluticasone (FLONASE) 50 mcg/actuation nasal spray 2 sprays by Each Nare route daily. 16 g 11    gabapentin (NEURONTIN) 300 MG capsule TAKE 1 CAPSULE BY MOUTH TWICE DAILY 60 capsule 2    hydrOXYzine (ATARAX) 25 MG tablet Take one tablet up to twice daily as needed for itch. 60 tablet 0    naproxen (NAPROSYN) 500 MG tablet 1 tab As needed for onset of migraine 20 tablet 11    olmesartan (BENICAR) 20 MG tablet Take 0.5 tablets (10 mg total) by mouth daily. 10 mg per night ( half Tablet ) per PCP 45 tablet 4    progesterone (PROMETRIUM) 100 MG capsule Take 1 capsule (100 mg total) by mouth daily. 90 capsule 3    rizatriptan (MAXALT) 10 MG tablet TAKE 1 TABLET BY MOUTH FOR MIGRAINE. MAY REPEAT IN 2 HOURS. USE NO MORE THAN 2 TABS/24 HOURS OR 2-3 DAYS/WEEK. 15 tablet 12    rosuvastatin (CRESTOR) 20 MG tablet TAKE 1 TABLET BY MOUTH ONCE EVERY EVENING 90 tablet 4    semaglutide 7 mg Tab Take 7 mg by mouth daily. 90 tablet 4     No current facility-administered medications for this visit.        Changes to medications: Bonnie Harvey reports no changes at this time.    Medication list has been reviewed and updated in Epic: Yes    Allergies   Allergen Reactions    Shellfish Containing Products Nausea And Vomiting    Sulfa (Sulfonamide Antibiotics) Rash       Changes to allergies: No    Allergies have been reviewed and updated in Epic: Yes    SPECIALTY MEDICATION ADHERENCE     Dupixent - 1 left  Medication Adherence    Patient reported X missed doses in the last month: 0  Specialty Medication: Dupixent          Specialty medication(s) dose(s) confirmed: Regimen is correct and unchanged.     Are there any concerns with adherence? No  Adherence counseling provided? Not needed    CLINICAL MANAGEMENT AND INTERVENTION      Clinical Benefit Assessment:    Do you feel the medicine is effective or helping your condition? Yes    Clinical Benefit counseling provided? Not needed    Adverse Effects Assessment:    Are you experiencing any side effects? No    Are you experiencing difficulty administering your medicine? No    Quality of Life Assessment:    Quality of Life    Rheumatology  Oncology  Dermatology  1. What impact has your specialty medication had on the symptoms of your skin condition (i.e. itchiness, soreness, stinging)?: Minimal  2. 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 For example, brushing your teeth or getting up in the morning. Patient declined to answer    Have you discussed this with your provider? Not needed    Acute Infection Status:    Acute infections noted within Epic:  No active infections  Patient reported infection: None    Therapy Appropriateness:    Is therapy appropriate based on current medication list, adverse reactions, adherence, clinical benefit and progress toward achieving therapeutic goals? Yes, therapy is appropriate and should be continued     DISEASE/MEDICATION-SPECIFIC INFORMATION      For patients on injectable medications: Patient currently has 1 doses left.  Next injection is scheduled for 2/5.    Chronic Inflammatory Diseases: Have you experienced any flares in the last month? No  Has this been reported to your provider? No    PATIENT SPECIFIC NEEDS     Does the patient have any physical, cognitive, or cultural barriers? No    Is the patient high risk? No    Did the patient require a clinical intervention? No    Does the patient require physician intervention or other additional services (i.e., nutrition, smoking cessation, social work)? No    Does the patient have an additional or emergency contact listed in their chart? Yes    SOCIAL DETERMINANTS OF HEALTH     At the [facility], we have learned that life circumstances - like trouble affording food, housing, utilities, or transportation can affect the health of many of our patients.   That is why we wanted to ask: are you currently experiencing any life circumstances that are negatively impacting your health and/or quality of life? Patient declined to answer    Social Drivers of Health     Food Insecurity: Not on file   Internet Connectivity: Not on file   Housing/Utilities: Not on file   Tobacco Use: Medium Risk (11/17/2023)    Patient History     Smoking Tobacco Use: Former     Smokeless Tobacco Use: Never     Passive Exposure: Past   Transportation Needs: No Transportation Needs (11/12/2022)    PRAPARE - Therapist, art (Medical): No     Lack of Transportation (Non-Medical): No   Alcohol Use: Not At Risk (06/04/2021)    Alcohol Use     How often do you have a drink containing alcohol?: Monthly or less     How many drinks containing alcohol do you have on a typical day when you are drinking?: 1 - 2  How often do you have 5 or more drinks on one occasion?: Never   Interpersonal Safety: Not on file   Physical Activity: Not on file   Intimate Partner Violence: Not At Risk (09/29/2022)    Humiliation, Afraid, Rape, and Kick questionnaire     Fear of Current or Ex-Partner: No     Emotionally Abused: No     Physically Abused: No     Sexually Abused: No   Stress: Not on file   Substance Use: Not on file (08/19/2023)   Social Connections: Not on file   Financial Resource Strain: Not on file   Depression: Not at risk (07/10/2022)    PHQ-2     PHQ-2 Score: 0   Health Literacy: Not on file       Would you be willing to receive help with any of the needs that you have identified today? Not applicable       SHIPPING     Specialty Medication(s) to be Shipped:   Inflammatory Disorders: Dupixent    Other medication(s) to be shipped: No additional medications requested for fill at this time     Changes to insurance: No    Delivery Scheduled: Yes, Expected medication delivery date: 2/7.     Medication will be delivered via Same Day Courier to the confirmed prescription address in [facility].    The patient will receive a drug information handout for each medication shipped and additional FDA Medication Guides as required.  Verified that patient has previously received a Conservation officer, historic buildings and a Surveyor, mining.    The patient or caregiver noted above participated in the development of this care plan and knows that they can request review of or adjustments to the care plan at any time.      All of the patient's questions and concerns have been addressed.    [facility] Specialty Pharmacist

## 2023-11-18 NOTE — Unmapped (Signed)
Copied from CRM #[id number]. Topic: Access To Clinicians - Req Clinic Call Back  >> Nov 18, 2023 10:12 AM Erma Pinto wrote:  The patient called requesting: Provider can confirm if PT decides the Jardine which MG between 10 or 25 MG will he be considering.         Preferred Contact: Cell Phone Routine callback turnaround time: 24-48 business hours. Programmer, systems Notified)

## 2023-11-20 MED FILL — DUPIXENT 300 MG/2 ML SUBCUTANEOUS PEN INJECTOR: SUBCUTANEOUS | 28 days supply | Qty: 4 | Fill #1

## 2023-11-23 NOTE — Unmapped (Signed)
Pt called nurse advice to clarify where her patch goes. Per Edison Simon, it will be placed on lower abd. Pt VU

## 2023-11-30 NOTE — Unmapped (Signed)
 Pt left message stating that she has been using the estradiol patch and today is the day to change it. Stated that it is lifting up and she has used bandaids to keep it in place.     Spoke with pt  -pt stated that takes a bath every night and the patch has been lifting up.  -advised that she can place on lower back as well. Advised to look at insert as to locations for application  -advised that soaking in tub can loosen the adhesive and that she can use tegaderm if desires to cover the patch with each weekly application.  -may use alcohol to cleanse oils from skin prior to application  -touch base with clinic if any other concerns of patch not sticking. Some manufacturers have better adhesive than others. May need to change manufacturer  -if patch not working, contact clinic for alternatives. Pt appreciative.

## 2023-12-03 NOTE — Unmapped (Signed)
 Received fax stating PA for Dupixent has been approved and is good until 10/02/23 through 10/01/2024. Patient notified via MyChart. Approval letter to be scanned into patients chart.

## 2023-12-14 ENCOUNTER — Ambulatory Visit: Admit: 2023-12-14 | Discharge: 2023-12-15 | Payer: BLUE CROSS/BLUE SHIELD | Attending: Family | Primary: Family

## 2023-12-14 DIAGNOSIS — J101 Influenza due to other identified influenza virus with other respiratory manifestations: Principal | ICD-10-CM

## 2023-12-14 DIAGNOSIS — R059 Cough, unspecified type: Principal | ICD-10-CM

## 2023-12-14 MED ORDER — OSELTAMIVIR 75 MG CAPSULE
ORAL_CAPSULE | Freq: Two times a day (BID) | ORAL | 0 refills | 5.00 days | Status: CP
Start: 2023-12-14 — End: 2023-12-19

## 2023-12-14 NOTE — Unmapped (Signed)
 Assessment/Plan:    Bonnie Harvey was seen today for uri.    Diagnoses and all orders for this visit:    Influenza A  -     oseltamivir (TAMIFLU) 75 MG capsule; Take 1 capsule (75 mg total) by mouth two (2) times a day for 5 days.    Cough, unspecified type  -     POCT SARS/FLU/RSV - RN OBTAIN    Other orders  -     POCT SARS/Flu/RSV    Influenza A positive. Patient is in no acute distress, stable for discharge at this time. Will give Tamiflu BID for 5 days.   May take tylenol or ibuprofen as needed for fever and aches.    Advised to stay-at-home, rest, and drink plenty of fluids.    Discussed how influenza is contagious.  Reviewed CDC recommendations regarding respiratory viruses, may be around others once patient is fever free for 24 hours without fever reducing medications and symptoms are improving.  Return precautions discussed  Follow-up with PCP next week if needed  Seek emergency medical care if develop trouble breathing, shortness of breath,  pain or pressure in chest or abdomen, feeling confused or develop severe vomiting.      I have discussed assessment and plan of care as well as any prescriptions given and expected course of treatment.  Patient acknowledged understanding and agreed to the plan.  No further concerns or questions at this time.    Subjective:      Patient ID: Bonnie Harvey is a 58 y.o. female who is being seen for   Chief Complaint   Patient presents with    URI     Started yesterday afternoon with cough,headache,runny nose,chills.       HPI:     URI   This is a new problem. The current episode started yesterday. The problem has been gradually worsening. Maximum temperature: subjective. The fever has been present for Less than 1 day. Associated symptoms include congestion, coughing, headaches and rhinorrhea. Pertinent negatives include no abdominal pain, chest pain, diarrhea, dysuria, ear pain, joint pain, joint swelling, nausea, neck pain, plugged ear sensation, rash, sinus pain, sneezing, sore throat, swollen glands, vomiting or wheezing. She has tried acetaminophen and NSAIDs for the symptoms. The treatment provided mild relief.   Sick contacts: Both her grandchildren were recently diagnosed with influenza    Past Medical/Surgical and Social History:     Past Medical History:   Diagnosis Date    Alopecia     Anxiety     Borderline diabetes     Colon polyps     Depression     Eczema     Hypertension     Migraine     Type 2 diabetes mellitus without complication, without long-term current use of insulin (CMS-HCC) 09/06/2019    URI (upper respiratory infection) 09/02/2021    negative for COVID, RSV, and flu     Past Surgical History:   Procedure Laterality Date    CHOLECYSTECTOMY      KIDNEY STONE SURGERY  1984    PR COLONOSCOPY FLX DX W/COLLJ SPEC WHEN PFRMD N/A 09/11/2021    Procedure: COLONOSCOPY, FLEXIBLE, PROXIMAL TO SPLENIC FLEXURE; DIAGNOSTIC, W/WO COLLECTION SPECIMEN BY BRUSH OR WASH;  Surgeon: Rich Brave, MD;  Location: [facility];  Service: Gastroenterology    SKIN BIOPSY            Allergies:     Shellfish containing products and Sulfa (sulfonamide antibiotics)  Current Medications:     Current Outpatient Medications   Medication Sig Dispense Refill    dupilumab (DUPIXENT PEN) 300 mg/2 mL PnIj Inject the contents of 1 pen (300 mg) under the skin every fourteen (14) days. Maintenance 4 mL 5    empty container Misc Use as directed to dispose of Dupixent pens. 1 each 2    escitalopram oxalate (LEXAPRO) 10 MG tablet TAKE 1 TABLET BY MOUTH ONCE DAILY (Patient taking differently: Take 0.5 tablets (5 mg total) by mouth daily.) 90 tablet 4    estradiol (CLIMARA) 0.025 mg/24 hr Place 1 patch on the skin every seven (7) days. 12 patch 3    estradiol (VAGIFEM) 10 mcg vaginal tablet Use one tab in vagina nightly for 2 weeks, then twice weekly 18 tablet 8    fexofenadine HCl (ALLEGRA ORAL) Take 180 mg by mouth in the morning.      gabapentin (NEURONTIN) 300 MG capsule TAKE 1 CAPSULE BY MOUTH TWICE DAILY 60 capsule 2    hydrOXYzine (ATARAX) 25 MG tablet Take one tablet up to twice daily as needed for itch. 60 tablet 0    naproxen (NAPROSYN) 500 MG tablet 1 tab As needed for onset of migraine 20 tablet 11    olmesartan (BENICAR) 20 MG tablet Take 0.5 tablets (10 mg total) by mouth daily. 10 mg per night ( half Tablet ) per PCP 45 tablet 4    progesterone (PROMETRIUM) 100 MG capsule Take 1 capsule (100 mg total) by mouth daily. 90 capsule 3    rizatriptan (MAXALT) 10 MG tablet TAKE 1 TABLET BY MOUTH FOR MIGRAINE. MAY REPEAT IN 2 HOURS. USE NO MORE THAN 2 TABS/24 HOURS OR 2-3 DAYS/WEEK. 15 tablet 12    rosuvastatin (CRESTOR) 20 MG tablet TAKE 1 TABLET BY MOUTH ONCE EVERY EVENING 90 tablet 4    semaglutide 7 mg Tab Take 7 mg by mouth daily. 90 tablet 4    fluticasone (FLONASE) 50 mcg/actuation nasal spray 2 sprays by Each Nare route daily. 16 g 11    oseltamivir (TAMIFLU) 75 MG capsule Take 1 capsule (75 mg total) by mouth two (2) times a day for 5 days. 10 capsule 0     No current facility-administered medications for this visit.       I have reviewed and (if needed) updated the patient's problem list, medications, allergies, past medical and surgical history, preventive services, and social and family history.  ROS:     A 14 point review of systems was negative except for pertinent items noted in the HPI    Vital Signs:          12/14/23 1733   BP: 113/74   Pulse: 68   Resp: 15   Temp: 37.5 ??C (99.5 ??F)   SpO2: 98%   Weight: 64.4 kg (142 lb)   Height: 154.9 cm (5' 0.98)   PainSc: 5    PainLoc: Generalized      Vitals:    12/14/23 1733   BP: 113/74   BP Position: Sitting   Pulse: 68   Resp: 15   Temp: 37.5 ??C (99.5 ??F)   TempSrc: Oral   SpO2: 98%   Weight: 64.4 kg (142 lb)   Height: 154.9 cm (5' 0.98)     Body mass index is 26.84 kg/m??.    Objective:      Physical Exam  Vitals reviewed.   Constitutional:       General: She is not in acute distress.  Appearance: Normal appearance. She is not ill-appearing, toxic-appearing or diaphoretic.   HENT:      Head: Normocephalic and atraumatic.      Right Ear: Tympanic membrane, ear canal and external ear normal.      Left Ear: Tympanic membrane, ear canal and external ear normal.      Nose: Congestion and rhinorrhea present.      Mouth/Throat:      Mouth: Mucous membranes are moist.      Pharynx: Oropharynx is clear. Posterior oropharyngeal erythema present. No oropharyngeal exudate.   Eyes:      Extraocular Movements: Extraocular movements intact.      Conjunctiva/sclera: Conjunctivae normal.      Pupils: Pupils are equal, round, and reactive to light.   Cardiovascular:      Rate and Rhythm: Normal rate and regular rhythm.      Pulses: Normal pulses.      Heart sounds: Normal heart sounds. No murmur heard.  Pulmonary:      Effort: Pulmonary effort is normal. No respiratory distress.      Breath sounds: Normal breath sounds. No stridor. No wheezing, rhonchi or rales.   Chest:      Chest wall: No tenderness.   Musculoskeletal:         General: No swelling. Normal range of motion.      Cervical back: Normal range of motion and neck supple. No rigidity or tenderness.   Lymphadenopathy:      Cervical: No cervical adenopathy.   Skin:     General: Skin is warm and dry.      Capillary Refill: Capillary refill takes less than 2 seconds.   Neurological:      General: No focal deficit present.      Mental Status: She is alert and oriented to person, place, and time.      Gait: Gait normal.   Psychiatric:         Mood and Affect: Mood normal.         Behavior: Behavior normal.         Thought Content: Thought content normal.         Judgment: Judgment normal.        PHQ-2 Score:  PHQ-2 Total Score : 0   PHQ-9 Score:      Screening complete, no depression identified / no further action needed today   Labs:     Results for orders placed or performed in visit on 12/14/23   POCT SARS/Flu/RSV   Result Value Ref Range    POCT SARS-CoV-2 NAA Negative Negative    Influenza A Positive (A) Negative    Influenza B Negative Negative    POC Rapid RSV, NAA Negative Negative    Operator ID Pegram, Deborah        Follow-up:   Return if symptoms worsen or fail to improve.  Follow-up as Needed  and Follow-up with PCP    Jacqualin Combes, NP      *Patient note was created using Dragon Dictation. Any errors in syntax or even information may not have been identified and edited on initial review prior to signing this note.*

## 2023-12-14 NOTE — Unmapped (Signed)
 For pain/body aches:   Take any of the following as needed:        Tylenol 2 tabs every 6 hours as needed, or        Aleve 2 tabs twice per day with food, or        Advil/Motrin/ibuprofen 200mg  2-3 tabs 4 times per day with food     Increase water intake  You lose a lot of water by coughing and mouth-breathing  If this water is not replaced, the membranes become slightly dry and will take longer to heal    Rest:  Your immune system steals a lot of your body's energy when fighting infections  (This is similar to how your home's lights dim when your air conditioner kicks on)  Thus, your body needs a lot more rest than usual  Don't jump up and increase your activity the minute you begin to feel better  You need to build up a little reserve in order not to ???run your gas tank dry??? right away and set yourself back a few days    Return or go to the ER if:  Severe shortness of breath  Bloody sputum  Still having fever over 100.5 two days from now  Severe chest pain  You're too weak to get up from chair or bed  You become confused    Follow-up with your PCP if not feeling at least 50% better in 3-4 days.

## 2023-12-18 NOTE — Unmapped (Signed)
 This pharmacist was notified by a technician that this patient has reported that they've missed 1 doses of their Dupixent due to having the flu (currently better now).. I have reached out to the patient and have determined that no further pharmacist action is needed.      Approximate time spent: 10-15 minutes    Elnora Morrison, PharmD, Clinical Specialty Pharmacist  [facility]    [facility] Refill Coordination Note    Specialty Medication(s) to be Shipped:   Inflammatory Disorders: Dupixent    Other medication(s) to be shipped: No additional medications requested for fill at this time     Bonnie Harvey, DOB: 04/26/1966  Phone: [phone] (home)       All above HIPAA information was verified with patient.     Was a Nurse, learning disability used for this call? No    Completed refill call assessment today to schedule patient's medication shipment from the [facility]  [phone]).  All relevant notes have been reviewed.     Specialty medication(s) and dose(s) confirmed: Regimen is correct and unchanged.   Changes to medications: [name] reports no changes at this time.  Changes to insurance: No  New side effects reported not previously addressed with a pharmacist or physician: None reported  Questions for the pharmacist: No    Confirmed patient received a Conservation officer, historic buildings and a Surveyor, mining with first shipment. The patient will receive a drug information handout for each medication shipped and additional FDA Medication Guides as required.       DISEASE/MEDICATION-SPECIFIC INFORMATION        For patients on injectable medications: Patient currently has 0 doses left.  Next injection is scheduled for 12/23/2023.    SPECIALTY MEDICATION ADHERENCE     Medication Adherence    Patient reported X missed doses in the last month: 0  Specialty Medication: DUPIXENT PEN 300 mg/2 mL Pnij  Patient is on additional specialty medications: No  Patient is on more than two specialty medications: No  Any gaps in refill history greater than 2 weeks in the last 3 months: no  Demonstrates understanding of importance of adherence: yes              Were doses missed due to medication being on hold? No    Dupixent Pen 300mg /66mL : 0 doses of medicine on hand     REFERRAL TO PHARMACIST     Referral to the pharmacist: Not needed      [facility]     Shipping address confirmed in Epic.       Delivery Scheduled: Yes, Expected medication delivery date: 12/22/2023.     Medication will be delivered via Same Day Courier to the prescription address in Epic [facility].    Elnora Morrison, PharmD   [facility]  Specialty Pharmacist

## 2023-12-22 MED FILL — DUPIXENT 300 MG/2 ML SUBCUTANEOUS PEN INJECTOR: SUBCUTANEOUS | 28 days supply | Qty: 4 | Fill #2

## 2024-01-01 NOTE — Unmapped (Signed)
 Patient called nurse advice line requesting a new prescription for estradiol gel . Patient is having difficulty keep patch on due to life style , go ing to the gym . Message sent to provider .

## 2024-01-04 ENCOUNTER — Ambulatory Visit
Admit: 2024-01-04 | Discharge: 2024-01-05 | Payer: BLUE CROSS/BLUE SHIELD | Attending: Internal Medicine | Primary: Internal Medicine

## 2024-01-04 DIAGNOSIS — Z6832 Body mass index (BMI) 32.0-32.9, adult: Principal | ICD-10-CM

## 2024-01-04 DIAGNOSIS — E6609 Other obesity due to excess calories: Principal | ICD-10-CM

## 2024-01-04 DIAGNOSIS — E66811 Class 1 obesity due to excess calories with serious comorbidity and body mass index (BMI) of 32.0 to 32.9 in adult: Principal | ICD-10-CM

## 2024-01-04 DIAGNOSIS — Z780 Asymptomatic menopausal state: Principal | ICD-10-CM

## 2024-01-04 DIAGNOSIS — I10 Essential (primary) hypertension: Principal | ICD-10-CM

## 2024-01-04 DIAGNOSIS — E119 Type 2 diabetes mellitus without complications: Principal | ICD-10-CM

## 2024-01-04 LAB — ALBUMIN / CREATININE URINE RATIO
ALBUMIN QUANT URINE: 0.4 mg/dL
ALBUMIN/CREATININE RATIO: 2.1 ug/mg (ref 0.0–30.0)
CREATININE, URINE: 194.5 mg/dL

## 2024-01-04 MED ORDER — ESTRADIOL 0.25 MG/0.25 GRAM (0.1 %) TRANSDERMAL GEL PACKET
PACK | Freq: Every day | TRANSDERMAL | 0 refills | 0 days | Status: CP
Start: 2024-01-04 — End: ?

## 2024-01-04 MED ORDER — OLMESARTAN 5 MG TABLET
ORAL_TABLET | Freq: Every day | ORAL | 4 refills | 90 days | Status: CP
Start: 2024-01-04 — End: 2025-01-03

## 2024-01-04 NOTE — Unmapped (Signed)
 Called patient to advice that provider has sent a prescription for gel for 1 month . Patient verbalizes understanding .

## 2024-01-04 NOTE — Unmapped (Signed)
 Patient Education        Learning About Carbohydrate (Carb) Counting and Eating Out When You Have Diabetes  Why plan your meals?     Meal planning can be a key part of managing diabetes. Planning meals and snacks with the right balance of carbohydrate, protein, and fat can help you keep your blood sugar at the target level you set with your doctor.  You don't have to eat special foods. You can eat what your family eats, including sweets once in a while. But you do have to pay attention to how often you eat and how much you eat of certain foods.  You may want to work with a dietitian or a diabetes educator. They can give you tips and meal ideas and can answer your questions about meal planning. This health professional can also help you reach a healthy weight if that is one of your goals.  What should you know about eating carbs?  Managing the amount of carbohydrate (carbs) you eat is an important part of healthy meals when you have diabetes. Carbohydrate is found in many foods.  Learn which foods have carbs. And learn the amounts of carbs in different foods.  Bread, cereal, pasta, and rice have about 15 grams of carbs in a serving. A serving is 1 slice of bread (1 ounce), ?? cup of cooked cereal, or 1/3 cup of cooked pasta or rice.  Fruits have 15 grams of carbs in a serving. A serving is 1 small fresh fruit, such as an apple or orange; ?? of a banana; ?? cup of cooked or canned fruit; ?? cup of fruit juice; 1 cup of melon or raspberries; or 2 tablespoons of dried fruit.  Milk and no-sugar-added yogurt have 15 grams of carbs in a serving. A serving is 1 cup of milk or 3/4 cup (6 oz) of no-sugar-added yogurt.  Starchy vegetables have 15 grams of carbs in a serving. A serving is ?? cup of mashed potatoes or sweet potato; 1 cup winter squash; ?? of a small baked potato; ?? cup of cooked beans; or ?? cup cooked corn or green peas.  Learn how much carbs to eat each day and at each meal. A dietitian or certified diabetes educator can teach you how to keep track of the amount of carbs you eat. This is called carbohydrate counting.  If you are not sure how to count carbohydrate grams, use the plate method to plan meals. It is a quick way to make sure that you have a balanced meal. It also can help you manage the amount of carbohydrate you eat at meals.  Divide your plate by types of foods. Put non-starchy vegetables on half the plate, protein foods on a fourth of the plate, and carbohydrate foods on the final fourth of the plate.  Try to eat about the same amount of carbs at each meal. Do not save up your daily allowance of carbs to eat at one meal.  Proteins have very little or no carbs. Examples of proteins are beef, chicken, [place], fish, eggs, tofu, cheese, cottage cheese, and peanut butter.  How can you eat out and still eat healthy?  Learn to estimate the serving sizes of foods that have carbohydrate. If you measure food at home, it will be easier to estimate the amount in a serving of restaurant food.  If the meal you order has too much carbohydrate (such as potatoes, corn, or baked beans), ask to have a low-carbohydrate food  instead. Ask for a salad or non-starchy vegetables like broccoli, cauliflower, green beans, or peppers.  If you eat more carbohydrate at a meal than you had planned, take a walk or do other exercise. This will help lower your blood sugar.  What are some tips for eating healthy?  Limit saturated fat, such as the fat from meat and dairy products. This is a healthy choice because people who have diabetes are at higher risk of heart disease. So choose lean cuts of meat and nonfat or low-fat dairy products. Use olive or canola oil instead of butter or shortening when cooking.  Don't skip meals. Your blood sugar may drop too low if you skip meals and take insulin or certain medicines for diabetes.  Check with your doctor before you drink alcohol. Alcohol can cause your blood sugar to drop too low. Alcohol can also cause a bad reaction if you take certain diabetes medicines.  Follow-up care is a key part of your treatment and safety. Be sure to make and go to all appointments, and call your doctor if you are having problems. It's also a good idea to know your test results and keep a list of the medicines you take.  Where can you learn more?  Go to [facility] at [email or web address].Armed forces logistics/support/administrative officer in the Menu. Enter [phone] in the search box to learn more about Learning About Carbohydrate (Carb) Counting and Eating Out When You Have Diabetes.  Current as of: July 02, 2022  Content Version: 14.3  ?? 2024 [place], [place].   Care instructions adapted under license by [facility]. If you have questions about a medical condition or this instruction, always ask your healthcare professional. Larene [place], [facility], disclaims any warranty or liability for your use of this information.

## 2024-01-04 NOTE — Unmapped (Signed)
 Assessment / Plan:    58 y.o. year old female with HTN and DM.    Assessment & Plan  Type 2 diabetes mellitus  Managing diabetes with semaglutide from a compound pharmacy, resulting in significant weight loss and improved health. Concern about potential discontinuation due to FDA regulations. Alternatives include obtaining medication directly from the manufacturer, though cost is a concern. Maintaining a low hemoglobin A1c is crucial.  - POC hemoglobin A1c =5.9  - Discuss alternative options for semaglutide if compounded version becomes unavailable.    Class 1 obesity  Weight reduced from 174 lbs to 138-142 lbs, attributed to semaglutide and dietary changes. Aware of potential weight regain if semaglutide is discontinued. Considering long-term strategies to maintain weight loss.  - Continue current weight management strategies.  - Discuss long-term weight maintenance strategies if semaglutide is discontinued.    Primary hypertension  Blood pressure well-controlled at 104/70 mmHg on 10 mg of olmesartan. Suggest reducing dose to 5 mg due to excellent control.  - Prescribe olmesartan 5 mg daily.  - Monitor blood pressure and adjust medication if necessary.    General Health Maintenance  Due for a urine test to check for proteinuria, important for diabetes monitoring.  - Perform urine test to check for proteinuria.      RTC 6 mo, sooner as needed    Patient / Caregiver understands medications and there are no barriers to adherence.  Patient / Caregiver understands diagnoses and management plan, and there are no barriers to communication.  AVS has been reviewed and provided to the patient / caregiver, and self-care goals were discussed.    Subjective:      History of Present Illness  Bonnie Harvey is a 58 year old female with diabetes, high blood pressure, and obesity who presents for follow-up.    She is managing her diabetes with Ozempic, compounded with B12 and B3, which has contributed to significant weight loss. Her weight fluctuates between 138 and 142 pounds, down from a previous high of 174 pounds. She experienced a five-week period without the medication due to insurance changes, during which she noticed an increase in appetite and food intake. She mentions fasting before the appointment, having only consumed coffee with cream and sugar.    For her high blood pressure, she is taking olmesartan at a dose of 10 mg, which she halves to 5 mg. Her blood pressure is reported as 104/70 mmHg.    She recently had the flu, which she contracted from her grandchildren, despite having received the flu shot. She describes the illness as severe, stating 'I don't think I've ever been that sick in my life.' She also had a bad reaction to Tamiflu, which she took on an empty stomach, exacerbating her symptoms.    No current headaches and no significant changes in a previously noted lymph node.      Patient Active Problem List   Diagnosis    Generalized anxiety disorder    Eczema    Migraine without aura and with status migrainosus, not intractable    Type 2 diabetes mellitus without complication, without long-term current use of insulin (CMS-HCC)    Class 1 obesity due to excess calories with serious comorbidity and body mass index (BMI) of 32.0 to 32.9 in adult    Primary hypertension    Nephrolithiasis       Medications:    Current Outpatient Medications   Medication Sig Dispense Refill    dupilumab (DUPIXENT PEN) 300 mg/2 mL PnIj  Inject the contents of 1 pen (300 mg) under the skin every fourteen (14) days. Maintenance 4 mL 5    escitalopram oxalate (LEXAPRO) 10 MG tablet TAKE 1 TABLET BY MOUTH ONCE DAILY (Patient taking differently: Take 0.5 tablets (5 mg total) by mouth daily.) 90 tablet 4    estradiol (CLIMARA) 0.025 mg/24 hr Place 1 patch on the skin every seven (7) days. 12 patch 3    estradiol 0.25 mg/0.25 gram (0.1 %) GlPk Place 1 Application on the skin daily. 30 packet 0    fexofenadine HCl (ALLEGRA ORAL) Take 180 mg by mouth in the morning.      gabapentin (NEURONTIN) 300 MG capsule TAKE 1 CAPSULE BY MOUTH TWICE DAILY 60 capsule 2    naproxen (NAPROSYN) 500 MG tablet 1 tab As needed for onset of migraine 20 tablet 11    olmesartan (BENICAR) 20 MG tablet Take 0.5 tablets (10 mg total) by mouth daily. 10 mg per night ( half Tablet ) per PCP 45 tablet 4    progesterone (PROMETRIUM) 100 MG capsule Take 1 capsule (100 mg total) by mouth daily. 90 capsule 3    rizatriptan (MAXALT) 10 MG tablet TAKE 1 TABLET BY MOUTH FOR MIGRAINE. MAY REPEAT IN 2 HOURS. USE NO MORE THAN 2 TABS/24 HOURS OR 2-3 DAYS/WEEK. 15 tablet 12    rosuvastatin (CRESTOR) 20 MG tablet TAKE 1 TABLET BY MOUTH ONCE EVERY EVENING 90 tablet 4    semaglutide 7 mg Tab Take 7 mg by mouth daily. 90 tablet 4    empty container Misc Use as directed to dispose of Dupixent pens. 1 each 2    estradiol (VAGIFEM) 10 mcg vaginal tablet Use one tab in vagina nightly for 2 weeks, then twice weekly (Patient not taking: Reported on 01/04/2024) 18 tablet 8    fluticasone (FLONASE) 50 mcg/actuation nasal spray 2 sprays by Each Nare route daily. 16 g 11    hydrOXYzine (ATARAX) 25 MG tablet Take one tablet up to twice daily as needed for itch. (Patient not taking: Reported on 01/04/2024) 60 tablet 0     No current facility-administered medications for this visit.     Allergies:    Allergies   Allergen Reactions    Shellfish Containing Products Nausea And Vomiting    Sulfa (Sulfonamide Antibiotics) Rash        ROS: >10 systems were reviewed and negative except as noted above in HPI      Objective:    Physical Exam:  Vitals:    01/04/24 1024   BP: 104/70   BP Site: L Arm   BP Position: Sitting   BP Cuff Size: Medium   Pulse: 83   SpO2: 98%   Weight: 64.9 kg (143 lb 1.6 oz)     Body mass index is 27.05 kg/m??.    GEN: well appearing and in NAD  EYES: Sclera anicteric.   ENT: Oropharynx clear. Mucus membranes moist.  NECK: Supple. No lymphadenopathy or thyromegaly.  CV: RRR, no murmurs appreciated. 2+ radial pulses.  PULM: CTA B. Normal work of breathing.  ABD: Soft. Non-tender. No palpable organomegaly.  EXT: No clubbing, cyanosis, or edema.    Lab Results   Component Value Date    A1C 5.6 07/20/2023        Hampton Abbot, M.D. 01/04/2024

## 2024-01-15 NOTE — Unmapped (Signed)
 [facility] Delivery Pharm[facility] Refill Coordination Note    Bonnie Harvey, Bonnie Harvey: 04-04-1966  Phone: [phone] (home)       All above HIPAA information was verified with patient.         01/15/2024    10:40 AM   Specialty Rx Medication Refill Questionnaire   Which Medications would you like refilled and shipped? Dupixent   Please list all current allergies: Shellfish and sulfa   Have you missed any doses in the last 30 days? No   Have you had any changes to your medication(s) since your last refill? No   How many days remaining of each medication do you have at home? Zero   If receiving an injectable medication, next injection date is 01/27/2024   Have you experienced any side effects in the last 30 days? No   Please enter the full address (street address, city, state, zip code) where you would like your medication(s) to be delivered to. [address], [place], [place] [address]   Please specify on which day you would like your medication(s) to arrive. Note: if you need your medication(s) within 3 days, please call the pharmacy to schedule your order at [phone]  01/26/2024   Has your insurance changed since your last refill? No   Would you like a pharmacist to call you to discuss your medication(s)? No   Do you require a signature for your package? (Note: if we are billing Medicare Part B or your order contains a controlled substance, we will require a signature) No   I have been provided my out of pocket cost for my medication and approve the pharmacy to charge the amount to my credit card on file. Yes         Completed refill call assessment today to schedule patient's medication shipment from the [facility] Delivery Pharmacy [phone]).  All relevant notes have been reviewed.       Confirmed patient received a Conservation officer, historic buildings and a Surveyor, mining with first shipment. The patient will receive a drug information handout for each medication shipped and additional FDA Medication Guides as required.         REFERRAL TO PHARMACIST     Referral to the pharmacist: Not needed      [facility]     Shipping address confirmed in Epic.     Delivery Scheduled: Yes, Expected medication delivery date: 4/15.     Medication will be delivered via Same Day Courier to the prescription address in Epic WAM.    [facility] Specialty Technician

## 2024-01-26 MED FILL — DUPIXENT 300 MG/2 ML SUBCUTANEOUS PEN INJECTOR: SUBCUTANEOUS | 28 days supply | Qty: 4 | Fill #3

## 2024-01-26 NOTE — Unmapped (Signed)
 Pt called nurse line requesting PA be started for estradiol  gel. Patches are not staying put on skin even when using tegaderm. PA submitted via CoverMyMeds. Key [id number]

## 2024-01-27 NOTE — Unmapped (Signed)
 Pt left message stating that she is on the estradiol  patch and continues to have severe night sweats. Two times a night she has to change her shirt and underwear due to the amount of sweat occurring.    Spoke with pt  -pt stated that she continues to use the tegaderm and uses a hair dryer to help the patch stick  -has not used alcohol as her skin is dry but will use it now to see if it sticks better  -has been taking progesterone  as prescribed   -estrogen gel has been denied by insurance and denial letter will be faxed to clinic  -advised pt to continue treatment as planned, keep scheduled follow up 02/16/24   -faxed denial letter will be addressed when received. If peer to peer necessary, provider can complete to possibly obtain coverage for form of medication desired.  -pt stated that she has skin irritation (red and itchiness) from use of the patches  Pt appreciative of call/advice.    Provider aware. Agrees with nurse advice.

## 2024-01-27 NOTE — Unmapped (Signed)
 Bonnie Harvey called from [facility] stating that the rx for estradiol  has been denied and she will be faxing denial letter.

## 2024-02-16 ENCOUNTER — Ambulatory Visit
Admit: 2024-02-16 | Discharge: 2024-02-17 | Payer: BLUE CROSS/BLUE SHIELD | Attending: "Women's Health Care | Primary: "Women's Health Care

## 2024-02-16 DIAGNOSIS — Z1231 Encounter for screening mammogram for malignant neoplasm of breast: Principal | ICD-10-CM

## 2024-02-16 DIAGNOSIS — Z780 Asymptomatic menopausal state: Principal | ICD-10-CM

## 2024-02-16 NOTE — Unmapped (Signed)
 Appeal sent via fax to [employer] - estradiol gel.

## 2024-02-16 NOTE — Unmapped (Signed)
 Outpatient Gynecology Note - Established Patient    ASSESSMENT AND PLAN     Problem List Items Addressed This Visit    None  Visit Diagnoses         Menopause    -  Primary    Relevant Medications    estradiol (VIVELLE-DOT) 0.0375 mg/24 hr (Start on 02/18/2024)      Breast cancer screening by mammogram        Relevant Orders    Mammo Digital Screening W Tomo Bilateral          Assessment & Plan  Menopausal symptoms  Persistent symptoms with current estradiol patch insufficient. Night sweats occur 75% of nights. Low libido managed with lubricants. She declined testosterone therapy.  - Change estradiol patch to 0.0375 mg/24 hr transdermal twice weekly.  - Continue progesterone 100 mg oral daily.  - Encourage scheduling of mammogram, due next month.    Skin reaction to hormone patches  Itchiness possibly due to Tegaderm or patch. Current patches not adhering well.. Oral estrogen not preferred due to thrombosis risk.  - Switch to a different brand of estradiol patch, smaller and applied twice weekly.  - Continue hydrocortisone cream as needed for itchiness.  - Check with insurance regarding appeal for gel formulation as an alternative to patches.      Return in about 5 months (around 07/18/2024). For full annual exam.    Bonnie Harvey, [facility]    SUBJECTIVE     This 58 y.o. [id number] is an established [facility] patient who presents for a problem visit.    HPI:  History of Present Illness  Bonnie Harvey is a 58 year old female who presents with issues related to estrogen patch adherence and skin reactions.    Her estrogen patches are not adhering properly, leading to skin reactions at the application site, including itchiness. Her insurance denied coverage for the estrogen gel, and an appeal is in process. She is currently using Tinaderm to keep her patches in place, she is using OTC hydrocortisone to manage itchy skin reaction.    She has been using a 0.025 mg once a week estrogen patch but experiences night sweats around 4 AM approximately 75% of the time, requiring her to change clothes. Despite this, her sleep and mood are generally good/improved with HRT. She is considering trying a different patch manufacturer or a different dosing schedule to improve adherence and reduce symptoms. She confirms taking progesterone every night.    She reports having no sex drive, which she attributes to multifactorial causes. She is not interested in pursuing testosterone therapy or sex therapy and manages her sex life with lubricants.     No vaginal bleeding or breast concerns. Her last routine gynecologic exam was approximately six months ago, and she is due for a mammogram next month, which she plans to schedule soon.    She takes frequent baths, which may affect patch adherence, and uses a hairdryer to dry the patch area after bathing. She prefers not to switch to oral estrogen due to concerns about increased risk for blood clotting.        GYN History:   GYN History:   Menstrual cycles: amenorrheic- last 05/2022  Last Pap: 07/2021 NILM HPV neg   Mammo BiRads 14 March 2023    Past medical and surgical history was reviewed and updated.    Current Outpatient Medications   Medication Sig Dispense Refill    dupilumab (DUPIXENT PEN) 300 mg/2 mL pen injector Inject  the contents of 1 pen (300 mg) under the skin every fourteen (14) days. Maintenance 4 mL 5    empty container Misc Use as directed to dispose of Dupixent pens. 1 each 2    fexofenadine HCl (ALLEGRA ORAL) Take 180 mg by mouth in the morning.      gabapentin (NEURONTIN) 300 MG capsule TAKE 1 CAPSULE BY MOUTH TWICE DAILY 60 capsule 2    olmesartan (BENICAR) 5 MG tablet Take 1 tablet (5 mg total) by mouth daily. 90 tablet 4    progesterone (PROMETRIUM) 100 MG capsule Take 1 capsule (100 mg total) by mouth daily. 90 capsule 3    rizatriptan (MAXALT) 10 MG tablet TAKE 1 TABLET BY MOUTH FOR MIGRAINE. MAY REPEAT IN 2 HOURS. USE NO MORE THAN 2 TABS/24 HOURS OR 2-3 DAYS/WEEK. 15 tablet 12    rosuvastatin (CRESTOR) 20 MG tablet TAKE 1 TABLET BY MOUTH ONCE EVERY EVENING 90 tablet 4    escitalopram oxalate (LEXAPRO) 10 MG tablet TAKE 1 TABLET BY MOUTH ONCE DAILY (Patient taking differently: Take 0.5 tablets (5 mg total) by mouth daily.) 90 tablet 4    estradiol (VAGIFEM) 10 mcg vaginal tablet Use one tab in vagina nightly for 2 weeks, then twice weekly (Patient not taking: Reported on 01/04/2024) 18 tablet 8    [START ON 02/18/2024] estradiol (VIVELLE-DOT) 0.0375 mg/24 hr Place 1 patch on the skin Two (2) times a week. 24 patch 2    estradiol 0.25 mg/0.25 gram (0.1 %) GlPk Place 1 Application on the skin daily. (Patient not taking: Reported on 02/16/2024) 30 packet 0    fluticasone (FLONASE) 50 mcg/actuation nasal spray 2 sprays by Each Nare route daily. 16 g 11    hydrOXYzine (ATARAX) 25 MG tablet Take one tablet up to twice daily as needed for itch. (Patient not taking: Reported on 01/04/2024) 60 tablet 0    naproxen (NAPROSYN) 500 MG tablet 1 tab As needed for onset of migraine (Patient not taking: Reported on 02/16/2024) 20 tablet 11     No current facility-administered medications for this visit.       Allergies   Allergen Reactions    Shellfish Containing Products Nausea And Vomiting    Sulfa (Sulfonamide Antibiotics) Rash       REVIEW OF SYSTEMS: See HPI; remaining balance of 10 systems are negative/non-contributory.    OBJECTIVE     BP 99/68  - Pulse 74  - Wt 64.7 kg (142 lb 11.2 oz)  - LMP 04/12/2022 (Approximate) Comment: last period 9-10 months prior - BMI 26.98 kg/m??   General: well-appearing and in NAD  Psych: pleasant and interactive, answers questions appropriately    A chaperone was present for any sensitive portions of the exam (if performed) including but not limited to, breast and pelvic examination.  If performed, consent was obtained from patient prior to the sensitive examination.  If medical students were involved, explicit additional consent was obtained.     LABS     Results for orders placed or performed in visit on 01/04/24   Albumin/creatinine urine ratio   Result Value Ref Range    Creat U 194.5 Undefined mg/dL    Albumin Quantitative, Urine 0.4 Undefined mg/dL    Albumin/Creatinine Ratio 2.1 0.0 - 30.0 ug/mg   POCT glycosylated hemoglobin (Hb A1C)   Result Value Ref Range    Hemoglobin A1C 5.9 4.0 - 6.0 %    A1C LOT NBR 126,054     A1C STRIP EXP 09/11/25

## 2024-02-18 MED ORDER — ESTRADIOL 0.0375 MG/24 HR SEMIWEEKLY TRANSDERMAL PATCH
MEDICATED_PATCH | TRANSDERMAL | 2 refills | 84.00000 days | Status: CP
Start: 2024-02-18 — End: ?

## 2024-02-18 NOTE — Unmapped (Signed)
 [facility] Delivery Pharm[facility] Refill Coordination Note    Specialty Medication(s) to be Shipped:   Inflammatory Disorders: Dupixent    Other medication(s) to be shipped: No additional medications requested for fill at this time     Raliyah Tavakoli, DOB: 1966/04/22  Phone: [phone] (home)       All above HIPAA information was verified with patient.     Was a Nurse, learning disability used for this call? No    Completed refill call assessment today to schedule patient's medication shipment from the [facility]  [phone]).  All relevant notes have been reviewed.     Specialty medication(s) and dose(s) confirmed: Regimen is correct and unchanged.   Changes to medications: Ragnhild reports no changes at this time.  Changes to insurance: No  New side effects reported not previously addressed with a pharmacist or physician: None reported  Questions for the pharmacist: No    Confirmed patient received a Conservation officer, historic buildings and a Surveyor, mining with first shipment. The patient will receive a drug information handout for each medication shipped and additional FDA Medication Guides as required.       DISEASE/MEDICATION-SPECIFIC INFORMATION        For patients on injectable medications: Patient currently has 0 doses left.  Next injection is scheduled for 02/24/2024.    SPECIALTY MEDICATION ADHERENCE     Medication Adherence    Patient reported X missed doses in the last month: 0  Specialty Medication: dupilumab (DUPIXENT PEN) 300 mg/2 mL pen injector  Patient is on additional specialty medications: No  Informant: patient              Were doses missed due to medication being on hold? No    Dupixent Pen 300mg /17mL : 0 doses of medicine on hand     REFERRAL TO PHARMACIST     Referral to the pharmacist: Not needed      [facility]     Shipping address confirmed in Epic.     Cost and Payment: Patient has a $0 copay, payment information is not required.    Delivery Scheduled: Yes, Expected medication delivery date: 02/23/2024.     Medication will be delivered via Same Day Courier to the prescription address in Epic WAM.    [facility] and [facility]

## 2024-02-23 MED FILL — DUPIXENT 300 MG/2 ML SUBCUTANEOUS PEN INJECTOR: SUBCUTANEOUS | 28 days supply | Qty: 4 | Fill #4

## 2024-02-25 DIAGNOSIS — L209 Atopic dermatitis, unspecified: Principal | ICD-10-CM

## 2024-02-25 DIAGNOSIS — L508 Other urticaria: Principal | ICD-10-CM

## 2024-02-25 DIAGNOSIS — L281 Prurigo nodularis: Principal | ICD-10-CM

## 2024-02-25 MED ORDER — DUPIXENT 300 MG/2 ML SUBCUTANEOUS PEN INJECTOR
SUBCUTANEOUS | 5 refills | 28.00000 days | Status: CP
Start: 2024-02-25 — End: ?
  Filled 2024-03-21: qty 4, 28d supply, fill #0

## 2024-03-15 NOTE — Unmapped (Signed)
 [facility] Specialty and Home Delivery Pharmacy Refill Coordination Note    Specialty Medication(s) to be Shipped:   Inflammatory Disorders: Dupixent    Other medication(s) to be shipped: No additional medications requested for fill at this time     Bonnie Harvey, DOB: 01-14-1966  Phone: [phone] (home)       All above HIPAA information was verified with patient.     Was a Nurse, learning disability used for this call? No    Completed refill call assessment today to schedule patient's medication shipment from the [facility] Delivery Pharm[facility]  [phone]).  All relevant notes have been reviewed.     Specialty medication(s) and dose(s) confirmed: Regimen is correct and unchanged.   Changes to medications: Artavia reports no changes at this time.  Changes to insurance: No  New side effects reported not previously addressed with a pharmacist or physician: None reported  Questions for the pharmacist: No    Confirmed patient received a Conservation officer, historic buildings and a Surveyor, mining with first shipment. The patient will receive a drug information handout for each medication shipped and additional FDA Medication Guides as required.       DISEASE/MEDICATION-SPECIFIC INFORMATION        For patients on injectable medications: Patient currently has 0 doses left.  Next injection is scheduled for 03/23/24.    SPECIALTY MEDICATION ADHERENCE     Medication Adherence    Patient reported X missed doses in the last month: 0  Specialty Medication: DUPIXENT PEN 300 mg/2 mL pen injector (dupilumab)  Patient is on additional specialty medications: No  Patient is on more than two specialty medications: No  Any gaps in refill history greater than 2 weeks in the last 3 months: no  Demonstrates understanding of importance of adherence: yes              Were doses missed due to medication being on hold? No    DUPIXENT PEN 300   mg/ml: 0 days of medicine on hand       REFERRAL TO PHARMACIST     Referral to the pharmacist: Not needed      [facility] Shipping address confirmed in Epic.     Cost and Payment: Patient has a $0 copay, payment information is not required.    Delivery Scheduled: Yes, Expected medication delivery date: 03/21/24.     Medication will be delivered via Same Day Courier to the prescription address in Epic WAM.    Stephen Ehrlich   [facility] Specialty and Home Delivery Pharmacy  Specialty Technician

## 2024-03-18 NOTE — Unmapped (Signed)
 Pt left message requesting an update on the appeal for the estradiol gel.    Spoke with [place] at [facility] [phone], option #3 and then #1)  -informed that form was not received from 02/16/24 and number faxed to was not the correct fax number. Form from website is not the correct form for appeal.  -will fax correct appeal form to be completed and returned, but pt must sign Member Appeal Representation Authorization Form to be faxed with completed provider appeal form.  -CoverMyMeds website not used by [employer] as they are phasing out the use of the website. Previously completed [facility] PA request 01/26/24 was not accepted.    Spoke with pt  -pt stated that new dosing of patch is working better for her and her night sweats are not as bad as they were. They are still there, but not as extreme  -advised to keep using as prescribed as it may take a couple extra months to be more effective  -informed that attachment was sent via MyChart for completion of PA Appeal. Advised to complete and resend via MyChart. Other paperwork will be completed after discussion with provider as to whether or not original dosing will be requested.  -pt has not tried [facility] .  -informed that provider will be notified of symptoms on new dosing     Spoke with pharmacist. Does not accept [facility]  Message sent to pt informing that [facility] does not accept [facility].

## 2024-03-21 NOTE — Unmapped (Signed)
 Patient called nurse advice line regarding needing assistance filling out PA forms for estradiol prescription. Per Rosanne Commodore RN note, this nurse was able to assist patient in filling out forms correctly. Patient to fill out forms and send back through [place] when completed.     Patient verbalized understanding, all questions answered.

## 2024-03-25 DIAGNOSIS — Z780 Asymptomatic menopausal state: Principal | ICD-10-CM

## 2024-03-25 MED ORDER — ESTRADIOL 0.05 MG/24 HR WEEKLY TRANSDERMAL PATCH
MEDICATED_PATCH | TRANSDERMAL | 3 refills | 84.00000 days | Status: CP
Start: 2024-03-25 — End: ?

## 2024-03-25 NOTE — Unmapped (Signed)
 Called pt per msg from Ginnie Pac NP regarding clarification of medication. Per pt she is awaiting PA for the gel and would like to know if possible if there was as higher dose in the patch that she could have or use in the meantime that would help with the hot flashes. Pt advised that msg would be sent to provider for recommendations. No further questions at this time

## 2024-03-25 NOTE — Unmapped (Signed)
 Pt left message on [facility] nurse line about her ongoing night sweats. Pt states that Friday night she had to change her clothes 3 times, and has tried sleeping with an ice pack. Pt states she is feeling very discouraged.    Per MyChart messages, the necessary paperwork for the estradiol gel PA appeal has been faxed to her insurance company.    Spoke with Jakena B. representative at [facility] [phone]) who states that they are missing the Member Appeal Representation Authorization Form. Fax receipt from 6/9 of this document is uploaded to the pts chart on 6/10. Jakena provided a fax number [phone] to send form, will refax and reupload to pts chart with fax receipt.    Pt states that at 0330-0430 AM night sweats are severe and she can't go back to sleep after cooling off and changing clothes.    Pt wants to know if she can have a higher dose of the patch while waiting for PA appeal to go through. RN will route message to Ginnie Pac, [facility].

## 2024-03-25 NOTE — Unmapped (Signed)
 Patient called [facility] at [facility] Nurse Triage line with concerns about her Estradiol patch medication.  She is currently taking 0.0375 mg patch.  Pt states she has been having severe night sweats, having to change bedding and clothing several times during the night and having sleep issues due to sweating and changing.Pt has tried sleeping with cold ice packs, thin sleep wear with no relief.  Discussed with provider Ginnie Pac, [facility]. Ginnie increased dose to 0.05mg  patch, wants follow up in 2 months. Pt is now scheduled for 05/26/24 at [facility] location.

## 2024-04-12 DIAGNOSIS — L281 Prurigo nodularis: Principal | ICD-10-CM

## 2024-04-12 DIAGNOSIS — L508 Other urticaria: Principal | ICD-10-CM

## 2024-04-12 DIAGNOSIS — L209 Atopic dermatitis, unspecified: Principal | ICD-10-CM

## 2024-04-12 MED ORDER — SHINGRIX (PF) 50 MCG/0.5 ML INTRAMUSCULAR SUSPENSION, KIT
INTRAMUSCULAR | 1 refills | 56.00000 days | Status: CP
Start: 2024-04-12 — End: 2024-06-08

## 2024-04-12 MED ORDER — DUPIXENT 300 MG/2 ML SUBCUTANEOUS PEN INJECTOR
SUBCUTANEOUS | 12 refills | 28.00000 days | Status: CP
Start: 2024-04-12 — End: ?
  Filled 2024-04-18: qty 4, 28d supply, fill #0

## 2024-04-12 NOTE — Unmapped (Signed)
 Call to [facility]. I relay that we have no documentation in her chart that she has had the shingles vaccine.  Bonnie Harvey verbalizes understanding and all questions answered.

## 2024-04-12 NOTE — Unmapped (Addendum)
 Meet your team:     Your intake nurse is: Demar Shad    Please remember to fill out the survey you will receive after your visit. Your comments help us  continue to improve our care.      Thanks in advance!      [facility] Clinical Staff

## 2024-04-12 NOTE — Unmapped (Signed)
 Rx sent as requested.  -BPB

## 2024-04-12 NOTE — Unmapped (Signed)
 Addended by: GWENN ARVELLA SQUIBB on: 04/12/2024 02:37 PM     Modules accepted: Orders

## 2024-04-12 NOTE — Unmapped (Signed)
 Copied from CRM [id number]. Topic: Access To Clinicians - Req Clinic Call Back  >> Apr 12, 2024 11:30 AM Sharlet HERO wrote:  Pt requesting a call back to confirm if whether or not she has had her Shingles vaccine.

## 2024-04-12 NOTE — Unmapped (Signed)
 Nanea calls back and she need a prescription for the shingles vaccine. She would like to go to [facility] in [place]. I advise that I will let Dr. Gwenn know and we will see about faxing a script to them for her. I will call her back and update her when able.

## 2024-04-12 NOTE — Unmapped (Signed)
 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 Maintenance  Dispense: 4 mL; Refill: 12    Alopecia areata, not currently active:     Return to clinic: 1 year       CHIEF COMPLAINT:  Follow-up alopecia areata and prurigo nodularis     HPI:   [name] who last saw me on 09/29/2023.  At that time she was seen for prurigo nodularis.  She also has alopecia areata.  We treated both over the last few years.  Her alopecia areata responded well to a Jak inhibitor, Olumiant .  That was not an issue for her at last visit.  At last visit we started her on Dupixent  for prurigo nodularis.    Her alopecia areata is doing very well.  She is also doing very well on Dupixent  and tolerating well.  Her itch is much improved.    Prior treatments with this have included gabapentin , methotrexate , mupirocin , prednisone , Dupixent , steroid injections.     PAST MEDICAL HISTORY:  Diabetes  HbA1c 12/31/2023 5.9  Anxiety  Depression  Migraine     MEDICATIONS:   Medications Ordered Prior to Encounter[1]    ALLERGIES:   Shellfish, sulfa    SOCIAL HISTORY:  [place] in [place]     REVIEW OF SYSTEMS:  Baseline state of health. No recent illnesses. No other skin complaints.     PHYSICAL EXAMINATION:  Examination in the presence of chaperone:  General: Well-developed, well-nourished. No acute distress.   Neuro: Alert and oriented, answers questions appropriately.  Skin: Focused examination of exposed skin of face and upper arms was performed and notable for the following:  No rash     Dictation software was used while making this note. Please excuse any errors made with dictation software.         [1]   Current Outpatient Medications on File Prior to Visit   Medication Sig Dispense Refill    dupilumab  (DUPIXENT  PEN) 300 mg/2 mL pen injector Inject the contents of 1 pen (300 mg) under the skin every fourteen (14) days. Maintenance 4 mL 5    empty container Misc Use as directed to dispose of Dupixent  pens. 1 each 2    escitalopram  oxalate (LEXAPRO ) 10 MG tablet TAKE 1 TABLET BY MOUTH ONCE DAILY (Patient taking differently: Take 0.5 tablets (5 mg total) by mouth daily.) 90 tablet 4    estradiol  (CLIMARA ) 0.05 mg/24 hr Place 1 patch on the skin once a week. 12 patch 3    estradiol  (VAGIFEM ) 10 mcg vaginal tablet Use one tab in vagina nightly for 2 weeks, then twice weekly (Patient not taking: Reported on 01/04/2024) 18 tablet 8    estradiol  (VIVELLE -DOT) 0.0375 mg/24 hr Place 1 patch on the skin Two (2) times a week. 24 patch 2    estradiol  0.25 mg/0.25 gram (0.1 %) GlPk Place 1 Application on the skin daily. (Patient not taking: Reported on 02/16/2024) 30 packet 0    fexofenadine HCl (ALLEGRA ORAL) Take 180 mg by mouth in the morning.      fluticasone (FLONASE) 50 mcg/actuation nasal spray 2 sprays by Each Nare route daily. 16 g 11    gabapentin  (NEURONTIN ) 300 MG capsule TAKE 1 CAPSULE BY MOUTH TWICE DAILY 60  capsule 2    hydrOXYzine  (ATARAX ) 25 MG tablet Take one tablet up to twice daily as needed for itch. (Patient not taking: Reported on 01/04/2024) 60 tablet 0    naproxen  (NAPROSYN ) 500 MG tablet 1 tab As needed for onset of migraine (Patient not taking: Reported on 02/16/2024) 20 tablet 11    olmesartan  (BENICAR ) 5 MG tablet Take 1 tablet (5 mg total) by mouth daily. 90 tablet 4    progesterone  (PROMETRIUM ) 100 MG capsule Take 1 capsule (100 mg total) by mouth daily. 90 capsule 3    rizatriptan  (MAXALT ) 10 MG tablet TAKE 1 TABLET BY MOUTH FOR MIGRAINE. MAY REPEAT IN 2 HOURS. USE NO MORE THAN 2 TABS/24 HOURS OR 2-3 DAYS/WEEK. 15 tablet 12    rosuvastatin  (CRESTOR ) 20 MG tablet TAKE 1 TABLET BY MOUTH ONCE EVERY EVENING 90 tablet 4     No current facility-administered medications on file prior to visit.

## 2024-04-12 NOTE — Unmapped (Signed)
 Pt called nurse advice stating she has had diarrhea for several days. Instructed pt to follow up with PCP. Pt VU

## 2024-04-12 NOTE — Unmapped (Signed)
 [facility] Specialty and Home Delivery Pharmacy Refill Coordination Note    Specialty Medication(s) to be Shipped:   Inflammatory Disorders: Dupixent     Other medication(s) to be shipped: No additional medications requested for fill at this time     Bonnie Harvey, DOB: 15-Aug-1966  Phone: [phone] (home)       All above HIPAA information was verified with patient.     Was a Nurse, learning disability used for this call? No    Completed refill call assessment today to schedule patient's medication shipment from the [facility] and [facility] Delivery Pharmacy  [phone]).  All relevant notes have been reviewed.     Specialty medication(s) and dose(s) confirmed: Regimen is correct and unchanged.   Changes to medications: [name] reports no changes at this time.  Changes to insurance: No  New side effects reported not previously addressed with a pharmacist or physician: None reported  Questions for the pharmacist: No    Confirmed patient received a Conservation officer, historic buildings and a Surveyor, mining with first shipment. The patient will receive a drug information handout for each medication shipped and additional FDA Medication Guides as required.       DISEASE/MEDICATION-SPECIFIC INFORMATION        For patients on injectable medications: Patient currently has 0 doses left.  Next injection is scheduled for 04/20/24.    SPECIALTY MEDICATION ADHERENCE     Medication Adherence    Patient reported X missed doses in the last month: 0  Specialty Medication: Rx #: [id number] dupilumab  (DUPIXENT  PEN) 300 mg/2 mL pen injector  Patient is on additional specialty medications: No  Informant: patient              Were doses missed due to medication being on hold? No    DUPIXENT  PEN 300   mg/ml: 0 days of medicine on hand       REFERRAL TO PHARMACIST     Referral to the pharmacist: Not needed      [facility]     Shipping address confirmed in Epic.     Cost and Payment: Patient has a $0 copay, payment information is not required.    Delivery Scheduled: Yes, Expected medication delivery date: 04/18/24.     Medication will be delivered via Same Day Courier to the prescription address in Epic WAM.    Jeoffrey JAYSON Sherra UNK Specialty and [facility]

## 2024-04-13 MED ORDER — SHINGRIX (PF) 50 MCG/0.5 ML INTRAMUSCULAR SUSPENSION, KIT
1 refills | 0.00000 days
Start: 2024-04-13 — End: ?

## 2024-04-13 NOTE — Unmapped (Signed)
 Call to Sherin to let her know that a prescription for Shingrix has been sent electronically to [facility] as requested. She verbalizes understanding and all questions answered.

## 2024-04-25 NOTE — Unmapped (Signed)
 Patient Bonnie Harvey on nurse advice line regarding some bleeding after estrogen increase. Patient stated bleeding only occurred over the weekend and has stopped. Vaginal bleeding was the only symptom experienced. Informed Patient that with an estrogen increase vaginally bleeding can occur. Informed patient of bleeding precautions such as saturating a pad or tampon in an hour for 2 consecutive hours to go to the ED to be evaluated. Patient Bonnie Harvey, all questions answered.

## 2024-05-17 DIAGNOSIS — R21 Rash and other nonspecific skin eruption: Principal | ICD-10-CM

## 2024-05-17 DIAGNOSIS — L281 Prurigo nodularis: Principal | ICD-10-CM

## 2024-05-17 DIAGNOSIS — L299 Pruritus, unspecified: Principal | ICD-10-CM

## 2024-05-17 MED ORDER — GABAPENTIN 300 MG CAPSULE
ORAL_CAPSULE | Freq: Two times a day (BID) | ORAL | 2 refills | 30.00000 days
Start: 2024-05-17 — End: ?

## 2024-05-17 NOTE — Unmapped (Signed)
 [facility] Specialty and Home Delivery Pharmacy Refill Coordination Note    Specialty Medication(s) to be Shipped:   Inflammatory Disorders: Dupixent     Other medication(s) to be shipped: No additional medications requested for fill at this time     Bonnie Harvey, DOB: 1966/09/30  Phone: [phone] (home)       All above HIPAA information was verified with patient.     Was a Nurse, learning disability used for this call? No    Completed refill call assessment today to schedule patient's medication shipment from the [facility] and Home Delivery Pharmacy  [phone]).  All relevant notes have been reviewed.     Specialty medication(s) and dose(s) confirmed: Regimen is correct and unchanged.   Changes to medications: [name] reports no changes at this time.  Changes to insurance: No  New side effects reported not previously addressed with a pharmacist or physician: None reported  Questions for the pharmacist: No    Confirmed patient received a Conservation officer, historic buildings and a Surveyor, mining with first shipment. The patient will receive a drug information handout for each medication shipped and additional FDA Medication Guides as required.       DISEASE/MEDICATION-SPECIFIC INFORMATION        For patients on injectable medications: Patient currently has 0 doses left.  Next injection is scheduled for 05/18/2024.    SPECIALTY MEDICATION ADHERENCE     Medication Adherence    Patient reported X missed doses in the last month: 0  Specialty Medication: dupilumab  (DUPIXENT  PEN) 300 mg/2 mL pen injector              Were doses missed due to medication being on hold? No     DUPIXENT  PEN 300 mg/2 mL pen injector (dupilumab ): 0 doses of medicine on hand       REFERRAL TO PHARMACIST     Referral to the pharmacist: Not needed      [facility]     Shipping address confirmed in Epic.     Cost and Payment: Patient has a $0 copay, payment information is not required.    Delivery Scheduled: Yes, Expected medication delivery date: 05/18/2024.     Medication will be delivered via Same Day Courier to the prescription address in Epic WAM.    Bonnie Harvey   [facility] Specialty and Home Delivery Pharmacy  Specialty Technician

## 2024-05-18 DIAGNOSIS — L281 Prurigo nodularis: Principal | ICD-10-CM

## 2024-05-18 DIAGNOSIS — R21 Rash and other nonspecific skin eruption: Principal | ICD-10-CM

## 2024-05-18 DIAGNOSIS — L299 Pruritus, unspecified: Principal | ICD-10-CM

## 2024-05-18 MED ORDER — GABAPENTIN 100 MG CAPSULE
ORAL_CAPSULE | ORAL | 1 refills | 0.00000 days | Status: CP
Start: 2024-05-18 — End: ?

## 2024-05-18 MED FILL — DUPIXENT 300 MG/2 ML SUBCUTANEOUS PEN INJECTOR: SUBCUTANEOUS | 28 days supply | Qty: 4 | Fill #1

## 2024-05-26 DIAGNOSIS — Z780 Asymptomatic menopausal state: Principal | ICD-10-CM

## 2024-05-26 MED ORDER — PROGESTERONE MICRONIZED 200 MG CAPSULE
ORAL_CAPSULE | Freq: Every evening | ORAL | 3 refills | 90.00000 days | Status: CP
Start: 2024-05-26 — End: 2025-05-26

## 2024-05-26 NOTE — Unmapped (Signed)
 Outpatient Gynecology Note - Established Patient    ASSESSMENT AND PLAN     Problem List Items Addressed This Visit    None  Visit Diagnoses         Menopause    -  Primary    Relevant Medications    progesterone  (PROMETRIUM ) 200 MG capsule        - 11/2023 Initially started a 0.025mg  E2 patch/Progesterone  100mg   - 02/16/2024 - reports trouble keeping pathces on, irritated skin, changed to a twice weekly Vivelle  Dot and Increased to 0.0375mg /P 100.  Attempted to get a E2 gel, but not covered by her insurance.  - 03/25/2024- pt called the nurse line with complaint of severe night sweats- so we increased dose to 0.05mg  ( once weekly patch)    Pt states that she has some trouble keeping the patches on, but she can re-adhere them using a hair dryer, as the edges curl up after her bathes.      She reports intermittent light spotting for the last 6 wks or so  Her vasomotor symptoms are improved, but not perfect.    Discussed concern about bleeding on HRT.  It is possibly that this is due to frequent dose changes and trouble with the patch adhering to her skin.  Plan to be very diligent about patch placement, and keeping it on, and will increase her Progesterone  to 200mg  nightly.  Advised that if she has bleeding after about 4 wks, we will need to schedule an ultrasound and endometrial biopsy.       Return in about 3 months (around 08/26/2024).    Ginnie GORMAN Pac, [facility]  I personally spent 25 minutes face-to-face and non-face-to-face in the care of this patient, which includes all pre, intra, and post visit time on the date of service.       SUBJECTIVE     This 58 y.o. [id number] is an established [facility] patient who presents for a problem visit.    HPI:  Her to follow up on HRT.  Overall symptoms are improved on the E2 0.05 weekly patch and Prog 100mg .  Some trouble with patch staying fully in place.  Denies missing any progesterone .  Intermittent spotting over last several weeks.    GYN History:   Menopause, last period 05/2022  Last Pap: 07/2021 NILM HPV neg     Past medical and surgical history was reviewed and updated.    Current Medications[1]    Allergies[2]    REVIEW OF SYSTEMS: See HPI; remaining balance of 10 systems are negative/non-contributory.    OBJECTIVE     BP 105/77  - Pulse 73  - Wt 66.5 kg (146 lb 11.2 oz)  - LMP 04/12/2022 (Approximate) Comment: last period 9-10 months prior - BMI 27.73 kg/m??   General: well-appearing and in NAD  Psych: pleasant and interactive, answers questions appropriately    A chaperone was present for any sensitive portions of the exam (if performed) including but not limited to, breast and pelvic examination.  If performed, consent was obtained from patient prior to the sensitive examination.  If medical students were involved, explicit additional consent was obtained.     LABS     Results for orders placed or performed in visit on 01/04/24   Albumin/creatinine urine ratio   Result Value Ref Range    Creat U 194.5 Undefined mg/dL    Albumin Quantitative, Urine 0.4 Undefined mg/dL    Albumin/Creatinine Ratio 2.1 0.0 - 30.0 ug/mg   POCT  glycosylated hemoglobin (Hb A1C)   Result Value Ref Range    Hemoglobin A1C 5.9 4.0 - 6.0 %    A1C LOT NBR 126,054     A1C STRIP EXP 09/11/25                [1]   Current Outpatient Medications   Medication Sig Dispense Refill    dupilumab  (DUPIXENT  PEN) 300 mg/2 mL pen injector Inject the contents of 1 pen (300 mg) under the skin every fourteen (14) days. Maintenance 4 mL 12    empty container Misc Use as directed to dispose of Dupixent  pens. 1 each 2    escitalopram  oxalate (LEXAPRO ) 10 MG tablet TAKE 1 TABLET BY MOUTH ONCE DAILY (Patient taking differently: Take 0.5 tablets (5 mg total) by mouth daily.) 90 tablet 4    estradiol  (CLIMARA ) 0.05 mg/24 hr Place 1 patch on the skin once a week. 12 patch 3    estradiol  (VAGIFEM ) 10 mcg vaginal tablet Use one tab in vagina nightly for 2 weeks, then twice weekly (Patient not taking: Reported on 01/04/2024) 18 tablet 8    estradiol  (VIVELLE -DOT) 0.0375 mg/24 hr Place 1 patch on the skin Two (2) times a week. 24 patch 2    estradiol  0.25 mg/0.25 gram (0.1 %) GlPk Place 1 Application on the skin daily. (Patient not taking: Reported on 02/16/2024) 30 packet 0    fexofenadine HCl (ALLEGRA ORAL) Take 180 mg by mouth in the morning.      fluticasone (FLONASE) 50 mcg/actuation nasal spray 2 sprays by Each Nare route daily. 16 g 11    gabapentin  (NEURONTIN ) 100 MG capsule Take two capsules nightly before bed. 180 capsule 1    hydrOXYzine  (ATARAX ) 25 MG tablet Take one tablet up to twice daily as needed for itch. (Patient not taking: Reported on 01/04/2024) 60 tablet 0    naproxen  (NAPROSYN ) 500 MG tablet 1 tab As needed for onset of migraine (Patient not taking: Reported on 02/16/2024) 20 tablet 11    olmesartan  (BENICAR ) 5 MG tablet Take 1 tablet (5 mg total) by mouth daily. 90 tablet 4    progesterone  (PROMETRIUM ) 200 MG capsule Take 1 capsule (200 mg total) by mouth nightly. 90 capsule 3    rizatriptan  (MAXALT ) 10 MG tablet TAKE 1 TABLET BY MOUTH FOR MIGRAINE. MAY REPEAT IN 2 HOURS. USE NO MORE THAN 2 TABS/24 HOURS OR 2-3 DAYS/WEEK. 15 tablet 12    rosuvastatin  (CRESTOR ) 20 MG tablet TAKE 1 TABLET BY MOUTH ONCE EVERY EVENING 90 tablet 4    varicella-zoster [id number], PF, (SHINGRIX , PF,) 50 mcg/0.5 mL SusR injection Inject 0.5 mL into the muscle every 8 weeks for 2 doses. Administer Dose #1 (0.5ml), then administer dose #2 two to six months later. 0.5 mL 1     No current facility-administered medications for this visit.   [2]   Allergies  Allergen Reactions    Shellfish Containing Products Nausea And Vomiting    Sulfa (Sulfonamide Antibiotics) Rash

## 2024-06-03 NOTE — Unmapped (Signed)
 Patient called Bonnie Harvey OB/GYN at [facility] Nurse Triage line, has noticed vaginal bleeding when wiping.  Pt states she was advised by Ginnie Pac, [facility] to call and schedule EMB asap if VB reoccurred. Discussed with pt taking Ibuprofen with food 1 hr prior to her appt.  Transferred pt to PAC, Ashlee, for appt. Now scheduled with Ginnie on 06/08/24.

## 2024-06-06 NOTE — Unmapped (Signed)
 Opened in error.  -BPB

## 2024-06-07 MED ORDER — OZEMPIC 0.25 MG OR 0.5 MG (2 MG/3 ML) SUBCUTANEOUS PEN INJECTOR
SUBCUTANEOUS | 4 refills | 112.00000 days | Status: CP
Start: 2024-06-07 — End: ?
  Filled 2024-07-08: qty 3, 28d supply, fill #0

## 2024-06-07 NOTE — Unmapped (Signed)
 Message received Requesting call back about shingles vaccine prescription. Please call [phone]    Call to [facility]. We discuss shingles vaccine and she will try another pharmacy.    She then asks questions about her Ozemic - she is now getting it through [employer]. She is having trouble with the compounding pharmacy and wants to have it sent to [facility]. She states her current does is 0.5mg /ml. I advise I will send request to Dr. Gwenn and will do PA that will likely follow. She understands the medication is likely not covered.

## 2024-06-07 NOTE — Unmapped (Signed)
 Addended by: GWENN ARVELLA SQUIBB on: 06/07/2024 12:11 PM     Modules accepted: Orders

## 2024-06-07 NOTE — Unmapped (Signed)
 Rx for ozempic  0.5 mg sent to [facility] pharmacy.  -BPB

## 2024-06-07 NOTE — Unmapped (Signed)
 Call to [facility] not accepting the Shingles vaccine order or the correction we sent in. Recommend finding alternate pharmacy for vaccination. Unable to reach [facility] by phone - left detailed message on her personal VM. Advised call back or send [facility] message for help/questions.

## 2024-06-08 ENCOUNTER — Encounter
Admit: 2024-06-08 | Discharge: 2024-06-08 | Payer: BLUE CROSS/BLUE SHIELD | Attending: "Women's Health Care | Primary: "Women's Health Care

## 2024-06-08 DIAGNOSIS — E119 Type 2 diabetes mellitus without complications: Principal | ICD-10-CM

## 2024-06-08 DIAGNOSIS — N950 Postmenopausal bleeding: Principal | ICD-10-CM

## 2024-06-08 DIAGNOSIS — Z780 Asymptomatic menopausal state: Principal | ICD-10-CM

## 2024-06-08 NOTE — Unmapped (Signed)
 Outpatient Gynecology Note - Established Patient    ASSESSMENT AND PLAN     Problem List Items Addressed This Visit    None  Visit Diagnoses         Menopause    -  Primary    Relevant Medications    estradiol  (VIVELLE ) 0.05 mg/24 hr (Start on 06/09/2024)      Postmenopause bleeding        Relevant Orders    Surgical pathology exam    US  Pelvis Transabdominal/Transvaginal        Last normal period was 05/2022  - 11/2023 Initially started a 0.025mg  E2 patch/Progesterone  100mg   - 02/16/2024 - reports trouble keeping pathces on, irritated skin, changed to a twice weekly Vivelle  Dot and Increased to 0.0375mg /P 100.  Attempted to get a E2 gel, but not covered by her insurance.  - 03/25/2024- pt called the nurse line with complaint of severe night sweats- so we increased dose to 0.05mg  ( once weekly patch)  - 05/26/2024- increased progesterone  to 200mg , and discussed keeping patch in place  - 06/08/2024- switched to vivelle  dot 0.05mg , stay on 200mg  progesterone .      Currently having bleeding again, so, will do EMB today. However, we discussed that this bleeding could be due to dosage changes and trouble with keeping her patch adhered to her skin properly.    Will get US  done in about one week.    No follow-ups on file.    Bonnie Harvey Pac, [facility]    SUBJECTIVE     This 58 y.o. [id number] is an established [facility] patient who presents for a problem visit.    HPI: Reports that since 8/14, she has been keeping her patches on with bandaids.  She has been taking progesterone  200mg  nightly.  On 8/22-26 she bled lightly, then just when wiping.  She has come in for an endometrial biopsy         Past medical and surgical history was reviewed and updated.    Current Medications[1]    Allergies[2]    REVIEW OF SYSTEMS: See HPI; remaining balance of 10 systems are negative/non-contributory.    OBJECTIVE     BP 115/81 (BP Site: L Arm, BP Position: Sitting, BP Cuff Size: Large)  - Pulse 71  - Wt 67.3 kg (148 lb 6.4 oz)  - LMP 04/12/2022 (Approximate) Comment: last period 9-10 months prior - BMI 28.05 kg/m??   General: well-appearing and in NAD  Lymph:  no inguinal lymphadenopathy  Abdomen:  soft, NT  Pelvic:  normal external genitalia, BUS negative, vagina and cervix are without gross lesions, uterus is normal size and non-tender, no CMT, no adnexal tenderness or palpable masses  Psych: pleasant and interactive, answers questions appropriately    A chaperone was present for any sensitive portions of the exam (if performed) including but not limited to, breast and pelvic examination.  If performed, consent was obtained from patient prior to the sensitive examination.  If medical students were involved, explicit additional consent was obtained.     LABS     Results for orders placed or performed in visit on 01/04/24   Albumin/creatinine urine ratio   Result Value Ref Range    Creat U 194.5 Undefined mg/dL    Albumin Quantitative, Urine 0.4 Undefined mg/dL    Albumin/Creatinine Ratio 2.1 0.0 - 30.0 ug/mg   POCT glycosylated hemoglobin (Hb A1C)   Result Value Ref Range    Hemoglobin A1C 5.9 4.0 - 6.0 %  A1C LOT NBR 126,054     A1C STRIP EXP 09/11/25                [1]   Current Outpatient Medications   Medication Sig Dispense Refill    dupilumab  (DUPIXENT  PEN) 300 mg/2 mL pen injector Inject the contents of 1 pen (300 mg) under the skin every fourteen (14) days. Maintenance 4 mL 12    empty container Misc Use as directed to dispose of Dupixent  pens. 1 each 2    escitalopram  oxalate (LEXAPRO ) 10 MG tablet TAKE 1 TABLET BY MOUTH ONCE DAILY (Patient taking differently: Take 0.5 tablets (5 mg total) by mouth daily.) 90 tablet 4    estradiol  (VAGIFEM ) 10 mcg vaginal tablet Use one tab in vagina nightly for 2 weeks, then twice weekly 18 tablet 8    fexofenadine HCl (ALLEGRA ORAL) Take 180 mg by mouth in the morning.      fluticasone (FLONASE) 50 mcg/actuation nasal spray 2 sprays by Each Nare route daily. 16 g 11    gabapentin  (NEURONTIN ) 100 MG capsule Take two capsules nightly before bed. 180 capsule 1    hydrOXYzine  (ATARAX ) 25 MG tablet Take one tablet up to twice daily as needed for itch. 60 tablet 0    naproxen  (NAPROSYN ) 500 MG tablet 1 tab As needed for onset of migraine 20 tablet 11    olmesartan  (BENICAR ) 5 MG tablet Take 1 tablet (5 mg total) by mouth daily. 90 tablet 4    progesterone  (PROMETRIUM ) 200 MG capsule Take 1 capsule (200 mg total) by mouth nightly. 90 capsule 3    rizatriptan  (MAXALT ) 10 MG tablet TAKE 1 TABLET BY MOUTH FOR MIGRAINE. MAY REPEAT IN 2 HOURS. USE NO MORE THAN 2 TABS/24 HOURS OR 2-3 DAYS/WEEK. 15 tablet 12    rosuvastatin  (CRESTOR ) 20 MG tablet TAKE 1 TABLET BY MOUTH ONCE EVERY EVENING 90 tablet 4    semaglutide  (OZEMPIC ) 0.25 mg or 0.5 mg (2 mg/3 mL) PnIj Inject 0.5 mg under the skin every seven (7) days. 12 mL 4    varicella-zoster [id number], PF, (SHINGRIX , PF,) 50 mcg/0.5 mL SusR injection Inject 0.5 mL into the muscle every 8 weeks for 2 doses. Administer Dose #1 (0.5ml), then administer dose #2 two to six months later. 0.5 mL 1    [START ON 06/09/2024] estradiol  (VIVELLE ) 0.05 mg/24 hr Place 1 patch on the skin Two (2) times a week. 24 patch 3     No current facility-administered medications for this visit.   [2]   Allergies  Allergen Reactions    Shellfish Containing Products Nausea And Vomiting    Sulfa (Sulfonamide Antibiotics) Rash

## 2024-06-08 NOTE — Unmapped (Signed)
 ePA initiated though Epic and questions answered.

## 2024-06-09 MED ORDER — ESTRADIOL 0.05 MG/24 HR SEMIWEEKLY TRANSDERMAL PATCH
MEDICATED_PATCH | TRANSDERMAL | 3 refills | 84.00000 days | Status: CP
Start: 2024-06-09 — End: 2024-09-07

## 2024-06-09 MED ORDER — ESCITALOPRAM 10 MG TABLET
ORAL_TABLET | Freq: Every day | ORAL | 4 refills | 90.00000 days | Status: CP
Start: 2024-06-09 — End: ?

## 2024-06-09 NOTE — Unmapped (Signed)
 [facility] Specialty Medication Onboarding    Specialty Medication: OZEMPIC  0.25 mg or 0.5 mg (2 mg/3 mL) Pnij (semaglutide )  Prior Authorization: Not Required   Financial Assistance: No - copay  <$25  Final Copay/Day Supply: $0 / 28 days     Insurance Restrictions: None     Notes to Pharmacist:   Credit Card on File: not applicable  Start Date on Rx:  06/07/24    The triage team has completed the benefits investigation and has determined that the patient is able to fill this medication at [facility]. Please contact the patient to complete the onboarding or follow up with the prescribing physician as needed.

## 2024-06-09 NOTE — Unmapped (Signed)
 Unable to complete refill request, patient is taking the medication differently per patient's current med list patient is taking 5mg .  Please advise on how to proceed.

## 2024-06-09 NOTE — Unmapped (Signed)
 PA for Ozempic  approved 06/08/24-06/08/25.

## 2024-06-17 MED FILL — DUPIXENT 300 MG/2 ML SUBCUTANEOUS PEN INJECTOR: SUBCUTANEOUS | 28 days supply | Qty: 4 | Fill #2

## 2024-06-17 NOTE — Unmapped (Addendum)
 This pharmacist was notified by a technician that this patient had questions about their Dupixent / missed dose. I have reached out to the patient and reviewed with patient missed-dose guidelines with Dupixent . Patient to resume Wed dosing on 9/17. I reviewed sometimes insurance limits our ability to follow manufacturer guidelines here (and will give RTS reject) but in her case, drug is allowed for delivery 9/26. I went ahead and set up next delivery for her for this date (9/26, same day courier, to prescription address).       Approximate time spent: >20 minutes    Gerard DELENA Sharps, Clinical Specialty Pharmacist  [facility]        [facility] Refill Coordination Note    Specialty Medication(s) to be Shipped:   Inflammatory Disorders: Dupixent     Other medication(s) to be shipped: No additional medications requested for fill at this time    Specialty Medications not needed at this time: N/A     Bonnie Harvey, DOB: [birth year]  Phone: [phone] (home)       All above HIPAA information was verified with patient.     Was a Nurse, learning disability used for this call? No    Completed refill call assessment today to schedule patient's medication shipment from the [facility]  [phone]).  All relevant notes have been reviewed.     Specialty medication(s) and dose(s) confirmed: Regimen is correct and unchanged.   Changes to medications: Bonnie Harvey reports no changes at this time.  Changes to insurance: No  New side effects reported not previously addressed with a pharmacist or physician: None reported  Questions for the pharmacist: Yes: changing dates     Confirmed patient received a Conservation officer, historic buildings and a Surveyor, mining with first shipment. The patient will receive a drug information handout for each medication shipped and additional FDA Medication Guides as required.       DISEASE/MEDICATION-SPECIFIC INFORMATION        For patients on injectable medications: Next injection is scheduled for 06/15/2024.    SPECIALTY MEDICATION ADHERENCE     Medication Adherence    Patient reported X missed doses in the last month: 0  Specialty Medication: DUPIXENT  PEN 300 mg/2 mL pen injector (dupilumab )  Patient is on additional specialty medications: No              Were doses missed due to medication being on hold? No      DUPIXENT  PEN 300 mg/2 mL pen injector (dupilumab ): 0 doses of medicine on hand       REFERRAL TO PHARMACIST     Referral to the pharmacist: Not needed      [facility]     Shipping address confirmed in Epic.     Cost and Payment: Patient has a $0 copay, payment information is not required.    Delivery Scheduled: Yes, Expected medication delivery date: 06/17/2024.     Medication will be delivered via Same Day Courier to the prescription address in Epic WAM.    [facility]  Specialty Technician

## 2024-07-05 NOTE — Unmapped (Signed)
 [facility] Refill Coordination Note    Specialty Medication(s) to be Shipped:   Inflammatory Disorders: Dupixent     Other medication(s) to be shipped: Ozempic     Specialty Medications not needed at this time: N/A     Bonnie Harvey, DOB: 02/05/1966  Phone: [phone] (home)       All above HIPAA information was verified with patient.     Was a Nurse, learning disability used for this call? No    Completed refill call assessment today to schedule patient's medication [facility]  [phone]).  All relevant notes have been reviewed.     Specialty medication(s) and dose(s) confirmed: Regimen is correct and unchanged.   Changes to medications: Bonnie Harvey reports no changes at this time.  Changes to insurance: No  New side effects reported not previously addressed with a pharmacist or physician: None reported  Questions for the pharmacist: No    Confirmed patient received a Conservation officer, historic buildings and a Surveyor, mining with first shipment. The patient will receive a drug information handout for each medication shipped and additional FDA Medication Guides as required.       DISEASE/MEDICATION-SPECIFIC INFORMATION        For patients on injectable medications: Next injection is scheduled for 9/27 - missed last week's dose due to being out of town - may explore seeing how she does at every 3 week dosing.    SPECIALTY MEDICATION ADHERENCE     Medication Adherence    Patient reported X missed doses in the last month: 1  Specialty Medication: Dupixent               Were doses missed due to medication being on hold? No    Dupixent  300 mg: 1 doses of medicine on hand     REFERRAL TO PHARMACIST     Referral to the pharmacist: Not needed      SHIPPING     Shipping address confirmed in Epic.     Cost and Payment: Patient has a $0 copay, payment information is not required.    Delivery Scheduled: Yes, Expected medication delivery date: 9/26.     Medication will be delivered via Same Day Courier to the prescription address in Epic WAM.    [facility]  Specialty Pharmacist

## 2024-07-08 MED FILL — DUPIXENT 300 MG/2 ML SUBCUTANEOUS PEN INJECTOR: SUBCUTANEOUS | 28 days supply | Qty: 4 | Fill #3

## 2024-07-18 ENCOUNTER — Encounter
Admit: 2024-07-18 | Discharge: 2024-07-18 | Payer: BLUE CROSS/BLUE SHIELD | Attending: Internal Medicine | Primary: Internal Medicine

## 2024-07-18 DIAGNOSIS — E119 Type 2 diabetes mellitus without complications: Principal | ICD-10-CM

## 2024-07-18 DIAGNOSIS — I10 Essential (primary) hypertension: Principal | ICD-10-CM

## 2024-07-18 DIAGNOSIS — Z0000 Encounter for general adult medical examination without abnormal findings: Principal | ICD-10-CM

## 2024-07-18 LAB — COMPREHENSIVE METABOLIC PANEL
ALBUMIN: 4.2 g/dL (ref 3.4–5.0)
ALKALINE PHOSPHATASE: 66 U/L (ref 46–116)
ALT (SGPT): 23 U/L (ref 10–49)
ANION GAP: 9 mmol/L (ref 5–14)
AST (SGOT): 22 U/L (ref <=34)
BILIRUBIN TOTAL: 0.5 mg/dL (ref 0.3–1.2)
BLOOD UREA NITROGEN: 11 mg/dL (ref 9–23)
BUN / CREAT RATIO: 18
CALCIUM: 9.3 mg/dL (ref 8.7–10.4)
CHLORIDE: 104 mmol/L (ref 98–107)
CO2: 26 mmol/L (ref 20.0–31.0)
CREATININE: 0.61 mg/dL (ref 0.55–1.02)
EGFR CKD-EPI (2021) FEMALE: >90 mL/min/1.73m2 (ref >=60)
GLUCOSE RANDOM: 84 mg/dL (ref 70–179)
POTASSIUM: 4.3 mmol/L (ref 3.4–4.8)
PROTEIN TOTAL: 7.1 g/dL (ref 5.7–8.2)
SODIUM: 139 mmol/L (ref 135–145)

## 2024-07-18 LAB — CBC W/ AUTO DIFF
BASOPHILS ABSOLUTE COUNT: 0 10*9/L (ref 0.0–0.1)
BASOPHILS RELATIVE PERCENT: 0.5 %
EOSINOPHILS ABSOLUTE COUNT: 0.1 10*9/L (ref 0.0–0.5)
EOSINOPHILS RELATIVE PERCENT: 1.6 %
HEMATOCRIT: 40.5 % (ref 34.0–44.0)
HEMOGLOBIN: 13.8 g/dL (ref 11.3–14.9)
LYMPHOCYTES ABSOLUTE COUNT: 2.3 10*9/L (ref 1.1–3.6)
LYMPHOCYTES RELATIVE PERCENT: 35.6 %
MEAN CORPUSCULAR HEMOGLOBIN CONC: 34 g/dL (ref 32.0–36.0)
MEAN CORPUSCULAR HEMOGLOBIN: 29.7 pg (ref 25.9–32.4)
MEAN CORPUSCULAR VOLUME: 87.4 fL (ref 77.6–95.7)
MEAN PLATELET VOLUME: 8.2 fL (ref 6.8–10.7)
MONOCYTES ABSOLUTE COUNT: 0.5 10*9/L (ref 0.3–0.8)
MONOCYTES RELATIVE PERCENT: 7.9 %
NEUTROPHILS ABSOLUTE COUNT: 3.5 10*9/L (ref 1.8–7.8)
NEUTROPHILS RELATIVE PERCENT: 54.4 %
PLATELET COUNT: 255 10*9/L (ref 150–450)
RED BLOOD CELL COUNT: 4.64 10*12/L (ref 3.95–5.13)
RED CELL DISTRIBUTION WIDTH: 14 % (ref 12.2–15.2)
WBC ADJUSTED: 6.5 10*9/L (ref 3.6–11.2)

## 2024-07-18 LAB — HEMOGLOBIN A1C
ESTIMATED AVERAGE GLUCOSE: 111 mg/dL
HEMOGLOBIN A1C: 5.5 % (ref 4.8–5.6)

## 2024-07-18 LAB — LIPID PANEL
CHOLESTEROL: 131 mg/dL (ref <200)
HDL CHOLESTEROL: 62 mg/dL (ref >50)
LDL CHOLESTEROL CALCULATED: 58 mg/dL (ref <100)
NON-HDL CHOLESTEROL: 69 mg/dL (ref <130)
TRIGLYCERIDES: 81 mg/dL (ref <150)

## 2024-07-18 NOTE — Unmapped (Signed)
 Assessment / Plan:      Pleasant 58 year old woman seen in clinic today for CPE.  She is doing well.    Assessment & Plan  Primary hypertension  - Her blood pressure is well-controlled on current medication.  No changes were made today.       Type 2 diabetes mellitus without complication, without long-term current use of insulin (CMS-HCC)  - Her blood sugars have been well-controlled on current medications.  Will check hemoglobin A1c with her labs today and make adjustments if necessary.       Class 1 obesity due to excess calories with serious comorbidity and body mass index (BMI) of 32.0 to 32.9 in adult  - Continue Ozempic , titrating up monthly as able.       Generalized anxiety disorder  - Stable on esitalopram, continue       General medical exam  - Healthcare maintenance items were reviewed and updated in epic as appropriate  -Flu and COVID-19 vaccines given today.  Orders:    CBC w/ Differential; Future    Comprehensive Metabolic Panel; Future    Lipid Panel; Future    Hemoglobin A1c; Future     RTC 6 mo, sooner as needed    Patient / Caregiver understands medications and there are no barriers to adherence.  Patient / Caregiver understands diagnoses and management plan, and there are no barriers to communication.  AVS has been reviewed and provided to the patient / caregiver, and self-care goals were discussed.    Subjective:      History of Present Illness  Bonnie Harvey is a 58 year old female who presents for follow-up of uterine biopsy results and knee bursitis.    She recently underwent an ultrasound that indicated a thickened uterine lining. A subsequent uterine biopsy returned normal results. She is scheduled for a hysteroscopy consultation to further investigate the uterine lining.    She is experiencing persistent knee bursitis, which has been bothersome. An orthopedic doctor recommended naproxen , ice, and Voltaren rub for management. Despite these treatments, there has been minimal improvement over the past six weeks.    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 Over the past two months, she has noticed a gradual weight gain of about five pounds, which she attributes to disruptions in her medication schedule. She is currently on a dose of 0.25 mg and recalls experiencing diarrhea at higher doses.    Her last hemoglobin A1c was 5.9 six months ago. She is up to date with her mammogram and colon cancer screening, with the last colonoscopy in November 2022 showing a 3 mm polyp and normal pathology, recommending a repeat in seven years.      Problem List[1]  Past Medical History[2]  Past Surgical History[3]    Medications:  Current Medications[4]  Allergies:  Allergies[5]     Family History:  Family History[6]  Social History:  Social History [7]    ROS: >10 systems were reviewed and negative except as noted above in HPI      Objective:    Physical Exam:  Vitals:    07/18/24 1025   BP: 118/80   BP Site: L Arm   BP Position: Sitting   BP Cuff Size: Medium   Pulse: 68   SpO2: 99%   Weight: 67.2 kg (148 lb 1.6 oz)   Height: 152.4 cm (5')     Body mass index is 28.92  kg/m??.    GEN: Pleasant, well-appearing woman in NAD  EYES: PERRLA. EOMI. Sclera anicteric. Conjunctiva pink.  ENT: TM's normal. Nares clear. Oropharynx clear. Mucus membranes moist.  NECK: Supple. No lymphadenopathy or thyromegaly.  CV: RRR, no murmurs appreciated. 2+ radial pulses.  PULM: CTA B. Normal work of breathing.  ABD: Soft, nontender, nondistended, +BS  EXT: No clubbing, cyanosis, or edema.  NEURO: No focal deficits.  PSYCH: appropriate mood and affect.  GU: deferred  MSK: Normal mass and tone.   SKIN: Warm and dry. No rash.    PHQ-9 Score: 0      Lab Results   Component Value Date    A1C 5.5 07/18/2024        Arvella Belvie Das, M.D. 07/23/2024          [1]   Patient Active Problem List  Diagnosis    Generalized anxiety disorder    Eczema Migraine without aura and with status migrainosus, not intractable    Type 2 diabetes mellitus without complication, without long-term current use of insulin (CMS-HCC)    Class 1 obesity due to excess calories with serious comorbidity and body mass index (BMI) of 32.0 to 32.9 in adult    Primary hypertension    Nephrolithiasis   [2]   Past Medical History:  Diagnosis Date    Alopecia     Anxiety     Borderline diabetes     Colon polyps     Depression     Eczema     Hypertension     Migraine     Type 2 diabetes mellitus without complication, without long-term current use of insulin (CMS-HCC) 09/06/2019    URI (upper respiratory infection) 09/02/2021    negative for COVID, RSV, and flu   [3]   Past Surgical History:  Procedure Laterality Date    CHOLECYSTECTOMY      KIDNEY STONE SURGERY  1984    PR COLONOSCOPY FLX DX W/COLLJ SPEC WHEN PFRMD N/A 09/11/2021    Procedure: COLONOSCOPY, FLEXIBLE, PROXIMAL TO SPLENIC FLEXURE; DIAGNOSTIC, W/WO COLLECTION SPECIMEN BY BRUSH OR WASH;  Surgeon: Lamar Specking Buccini, MD;  Location: [facility];  Service: Gastroenterology    SKIN BIOPSY     [4]   Current Outpatient Medications   Medication Sig Dispense Refill    dupilumab  (DUPIXENT  PEN) 300 mg/2 mL pen injector Inject the contents of 1 pen (300 mg) under the skin every fourteen (14) days. Maintenance 4 mL 12    empty container Misc Use as directed to dispose of Dupixent  pens. 1 each 2    escitalopram  oxalate (LEXAPRO ) 10 MG tablet TAKE 1 TABLET BY MOUTH ONCE DAILY 90 tablet 4    estradiol  (VAGIFEM ) 10 mcg vaginal tablet Use one tab in vagina nightly for 2 weeks, then twice weekly 18 tablet 8    estradiol  (VIVELLE ) 0.05 mg/24 hr Place 1 patch on the skin Two (2) times a week. 24 patch 3    fexofenadine HCl (ALLEGRA ORAL) Take 180 mg by mouth in the morning.      fluticasone (FLONASE) 50 mcg/actuation nasal spray 2 sprays by Each Nare route daily. 16 g 11    gabapentin  (NEURONTIN ) 100 MG capsule Take two capsules nightly before bed. 180 capsule 1    hydrOXYzine  (ATARAX ) 25 MG tablet Take one tablet up to twice daily as needed for itch. 60 tablet 0    naproxen  (NAPROSYN ) 500 MG tablet 1 tab As needed for onset of migraine  20 tablet 11    olmesartan  (BENICAR ) 5 MG tablet Take 1 tablet (5 mg total) by mouth daily. 90 tablet 4    progesterone  (PROMETRIUM ) 200 MG capsule Take 1 capsule (200 mg total) by mouth nightly. 90 capsule 3    rizatriptan  (MAXALT ) 10 MG tablet TAKE 1 TABLET BY MOUTH FOR MIGRAINE. MAY REPEAT IN 2 HOURS. USE NO MORE THAN 2 TABS/24 HOURS OR 2-3 DAYS/WEEK. 15 tablet 12    rosuvastatin  (CRESTOR ) 20 MG tablet TAKE 1 TABLET BY MOUTH ONCE EVERY EVENING 90 tablet 4    semaglutide  (OZEMPIC ) 0.25 mg or 0.5 mg (2 mg/3 mL) PnIj Inject 0.5 mg under the skin every seven (7) days. 12 mL 4     No current facility-administered medications for this visit.   [5]   Allergies  Allergen Reactions    Shellfish Containing Products Nausea And Vomiting    Sulfa (Sulfonamide Antibiotics) Rash   [6]   Family History  Problem Relation Age of Onset    Diabetes Mother     Dementia Mother     Skin cancer Mother     Melanoma Mother     Alzheimer's disease Mother     Hyperlipidemia Father     Cancer Maternal Grandfather     Colon cancer Maternal Grandmother     Cancer Maternal Grandmother     Leukemia Paternal Grandmother     Testicular cancer Son     Basal cell carcinoma Neg Hx     Squamous cell carcinoma Neg Hx    [7]   Social History  Socioeconomic History    Marital status: Married    Number of children: 2   Tobacco Use    Smoking status: Former     Current packs/day: 0.00     Types: Cigarettes     Quit date: 2014     Years since quitting: 11.7     Passive exposure: Past    Smokeless tobacco: Never    Tobacco comments:     Vaped nicotine until 2019   Vaping Use    Vaping status: Former   Substance and Sexual Activity    Alcohol use: Yes     Comment: rarely    Drug use: No    Sexual activity: Yes     Partners: Male     Birth control/protection: Vasectomy   Other Topics Concern    Do you use sunscreen? Yes    Tanning bed use? No    Are you easily burned? No    Excessive sun exposure? No    Blistering sunburns? No     Social Drivers of Health     Food Insecurity: No Food Insecurity (01/04/2024)    Hunger Vital Sign     Worried About Running Out of Food in the Last Year: Never true     Ran Out of Food in the Last Year: Never true   Transportation Needs: No Transportation Needs (01/04/2024)    PRAPARE - Therapist, art (Medical): No     Lack of Transportation (Non-Medical): No   Housing: Low Risk  (01/04/2024)    Housing     Within the past 12 months, have you ever stayed: outside, in a car, in a tent, in an overnight shelter, or temporarily in someone else's home (i.e. couch-surfing)?: No     Are you worried about losing your housing?: No

## 2024-07-18 NOTE — Unmapped (Signed)
Patient Education        Well Visit 50 to 50: Care Instructions  Overview     Well visits can help you stay healthy. Your doctor has checked your overall health and may have suggested ways to take good care of yourself. Your doctor also may have recommended tests. At home, you can help prevent illness with healthy eating, regular exercise, and other steps.  Follow-up care is a key part of your treatment and safety. Be sure to make and go to all appointments, and call your doctor if you are having problems. It's also a good idea to know your test results and keep a list of the medicines you take.  How can you care for yourself at home?  Get screening tests that you and your doctor decide on. Screening helps find diseases before any symptoms appear.  Eat healthy foods. Choose fruits, vegetables, whole grains, protein, and low-fat dairy foods. Limit fat, especially saturated fat. Reduce salt in your diet.  Limit alcohol. Have no more than 2 drinks a day or 14 drinks a week.  Get at least 30 minutes of exercise on most days of the week. Walking is a good choice. You also may want to do other activities, such as running, swimming, cycling, or playing tennis or team sports.  Reach and stay at a healthy weight. This will lower your risk for many problems, such as obesity, diabetes, heart disease, and high blood pressure.  Do not smoke. Smoking can make health problems worse. If you need help quitting, talk to your doctor about stop-smoking programs and medicines. These can increase your chances of quitting for good.  Care for your mental health. It is easy to get weighed down by worry and stress. Learn strategies to manage stress, like deep breathing and mindfulness, and stay connected with your family and community. If you find you often feel sad or hopeless, talk with your doctor. Treatment can help.  Talk to your doctor about whether you have any risk factors for sexually transmitted infections (STIs). You can help prevent STIs if you wait to have sex with a new partner (or partners) until you've each been tested for STIs. It also helps if you use condoms (female or female condoms) and if you limit your sex partners to one person who only has sex with you. Vaccines are available for some STIs.  If it's important to you to prevent pregnancy with your partner, talk with your doctor about birth control options that might be best for you.  If you think you may have a problem with alcohol or drug use, talk to your doctor. This includes prescription medicines (such as amphetamines and opioids) and illegal drugs (such as cocaine and methamphetamine). Your doctor can help you figure out what type of treatment is best for you.  Protect your skin from too much sun. When you're outdoors from 10 a.m. to 4 p.m., stay in the shade or cover up with clothing and a hat with a wide brim. Wear sunglasses that block UV rays. Even when it's cloudy, put broad-spectrum sunscreen (SPF 30 or higher) on any exposed skin.  See a dentist one or two times a year for checkups and to have your teeth cleaned.  Wear a seat belt in the car.  When should you call for help?  Watch closely for changes in your health, and be sure to contact your doctor if you have any problems or symptoms that concern you.  Where can you  learn more?  Go to [facility] at https://[facility]chart.[employer] forces logistics/support/administrative officer in the Menu. Enter [phone] in the search box to learn more about Well Visit 50 to 65: Care Instructions.  Current as of: May 18, 2022               Content Version: 14.0  ?? 2006-2024 [employer].   Care instructions adapted under license by [facility]. If you have questions about a medical condition or this instruction, always ask your healthcare professional. [employer] disclaims any warranty or liability for your use of this information.

## 2024-07-23 NOTE — Unmapped (Signed)
-   Continue Ozempic , titrating up monthly as able.

## 2024-07-23 NOTE — Unmapped (Signed)
-   Her blood pressure is well-controlled on current medication.  No changes were made today.

## 2024-07-23 NOTE — Unmapped (Signed)
-   Stable on esitalopram, continue

## 2024-07-23 NOTE — Unmapped (Signed)
-   Her blood sugars have been well-controlled on current medications.  Will check hemoglobin A1c with her labs today and make adjustments if necessary.

## 2024-07-27 NOTE — Unmapped (Signed)
 Pt contacted nurse advice line to state that initial consultation appointment was rescheduled from 10/21 to 11/11. Pt wanted to know from provider would this date be too far out. Pt also wanted to request if appointment on 11/11 with Ginnie Pac at 8:30 am could be canceled due to no bleeding since endometrial biopsy. Pt advised that this message would be forwarded to provider and to allow 3-5 business days for a response. Pt VU and had no further questions at this time.

## 2024-07-29 NOTE — Unmapped (Signed)
 Patient informed. Appt with Ginnie Pac, [facility] canceled.

## 2024-08-08 DIAGNOSIS — E119 Type 2 diabetes mellitus without complications: Principal | ICD-10-CM

## 2024-08-08 MED ORDER — OZEMPIC 1 MG/DOSE (4 MG/3 ML) SUBCUTANEOUS PEN INJECTOR
SUBCUTANEOUS | 4 refills | 84.00000 days | Status: CP
Start: 2024-08-08 — End: 2024-08-08
  Filled 2024-08-12: qty 3, 28d supply, fill #0

## 2024-08-08 NOTE — Addendum Note (Signed)
 Addended by: GWENN ARVELLA SQUIBB on: 08/08/2024 02:26 PM     Modules accepted: Orders

## 2024-08-08 NOTE — Addendum Note (Signed)
 Addended by: GWENN ARVELLA SQUIBB on: 08/08/2024 02:08 PM     Modules accepted: Orders

## 2024-08-09 NOTE — Progress Notes (Signed)
 I spoke with Bonnie Harvey - she's been spacing out her Dupixent  to every 3 week dosing for a few doses and is tolerating well with no flaring at all. She has 1 dose for 11/8. We'll plan to check in in a few weeks to assess her need for more.    Cherrill Scrima A. Claudene, PharmD, BCPS - Clinical Pharmacist   [facility]    [address], [place], [place]  [address]  t [phone], opt 4 then 2 - f [phone]

## 2024-08-10 NOTE — Telephone Encounter (Signed)
 I think we have sent it already, but let me know if you think I need to do anything else.    THanks,  [employer]

## 2024-08-18 DIAGNOSIS — R21 Rash and other nonspecific skin eruption: Principal | ICD-10-CM

## 2024-08-18 DIAGNOSIS — L281 Prurigo nodularis: Principal | ICD-10-CM

## 2024-08-18 DIAGNOSIS — L299 Pruritus, unspecified: Principal | ICD-10-CM

## 2024-08-18 MED ORDER — GABAPENTIN 100 MG CAPSULE
ORAL_CAPSULE | Freq: Every evening | ORAL | 1 refills | 90.00000 days
Start: 2024-08-18 — End: ?

## 2024-08-22 NOTE — Progress Notes (Signed)
 Outpatient Gynecology Note - Established Patient    ASSESSMENT AND PLAN     Problem List Items Addressed This Visit          Other    Postmenopausal bleeding - Primary    In setting of MHT  I personally reviewed the US  images. Uniform endometrial thickening to max of 6mm.   Discussed with benign EMB this is reassuring that there is no hyperplasia or malignancy. Discussed very small chance that EMB could have missed hyperplasia/malignancy. Reassuring that bleeding has stopped now that she is using an estrogen patch that is more reliable.  Patient is interested in increasing MHT dose due to continued night sweats. Reviewed in this case it may beneficial to undergo hysteroscopy, D+C in order to ensure it is safe to increase estrogen dosing.   After discussion of options she would like to proceed with hysteroscopy, D+C. Discussed anticipated outpatient procedure, associated risks, need for unplanned procedures as well as return precautions. Discussed activty restriction of pelvic rest x2 weeks. Reviewed role of residents and medical students in OR  Has recent labs, A1c and Cr normal. No additional preop testing needed  Ozempic  to be held for 1 week prior to OR  Request case with next available [facility]         Relevant Orders    Case request operating room: HYSTEROSCOPY, DIAGNOSTIC (SEPARATE PROCEDURE), DILATION AND CURETTAGE, DIAGNOSTIC AND/OR THERAPEUTIC (NON OBSTETRICAL) (Completed)    ASSIGN TO ENHANCED RECOVERY AFTER SURGERY PATHWAY       Chiquita Lie, MD    SUBJECTIVE     This 58 y.o. [phone] is an established [facility] patient who presents for a problem visit.    Bleeding on MHT, had been occasionally for a couple of days occasionally. Had been having issues with getting estrogen patches to stick. Now on vivelle  dot and patches are sticking. Still having nightly hot flashes.  Reports no bleeding at all since the EMB    EMB 06/08/24  Diagnosis   Endometrium, biopsy:  - Weakly proliferative endometrium.          US  Pelvis 06/23/24  Impression      The endometrium thickness measured 0.6 cm which is mildly thickened and abnormal in this patient with history of postmenopausal bleeding. The patient has already been assessed by gynecology.      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     Past medical and surgical history was reviewed and updated.    Current Medications[1]    Allergies[2]    REVIEW OF SYSTEMS: See HPI; remaining balance of 10 systems are negative/non-contributory.    OBJECTIVE     BP 132/66 (BP Site: R Arm, BP Position: Sitting)  - Pulse 91  - Wt 69.3 kg (152 lb 11.2 oz)  - LMP 04/12/2022 Comment: last period 9-10 months prior - BMI 29.82 kg/m??   General: well-appearing and in NAD  Psych: pleasant and interactive, answers questions appropriately    A chaperone was present for any sensitive portions of the exam (if performed) including but not limited to, breast and pelvic examination.  If performed, consent was obtained from patient prior to the sensitive examination.  If medical students were involved, explicit additional consent was obtained.     LABS     Results for orders placed or performed in visit on 07/18/24   Comprehensive Metabolic Panel   Result Value Ref Range    Sodium  139 135 - 145 mmol/L    Potassium 4.3 3.4 - 4.8 mmol/L    Chloride 104 98 - 107 mmol/L    CO2 26.0 20.0 - 31.0 mmol/L    Anion Gap 9 5 - 14 mmol/L    BUN 11 9 - 23 mg/dL    Creatinine 9.38 9.44 - 1.02 mg/dL    BUN/Creatinine Ratio 18     eGFR CKD-EPI (2021) Female >90 >=60 mL/min/1.19m2    Glucose 84 70 - 179 mg/dL    Calcium 9.3 8.7 - 89.5 mg/dL    Albumin 4.2 3.4 - 5.0 g/dL    Total Protein 7.1 5.7 - 8.2 g/dL    Total Bilirubin 0.5 0.3 - 1.2 mg/dL    AST 22 <=65 U/L    ALT 23 10 - 49 U/L    Alkaline Phosphatase 66 46 - 116 U/L   Lipid Panel   Result Value Ref Range    Cholesterol, Total 131 <200 mg/dL    Cholesterol, HDL 62 >50 mg/dL    Cholesterol, LDL, Calculated 58 <100 mg/dL Cholesterol, Non-HDL, Calculated 69 <130 mg/dL    Triglycerides 81 <849 mg/dL    Fasting No    Hemoglobin A1c   Result Value Ref Range    Hemoglobin A1C 5.5 4.8 - 5.6 %    Estimated Average Glucose 111 mg/dL   CBC w/ Differential   Result Value Ref Range    WBC 6.5 3.6 - 11.2 10*9/L    RBC 4.64 3.95 - 5.13 10*12/L    HGB 13.8 11.3 - 14.9 g/dL    HCT 59.4 65.9 - 55.9 %    MCV 87.4 77.6 - 95.7 fL    MCH 29.7 25.9 - 32.4 pg    MCHC 34.0 32.0 - 36.0 g/dL    RDW 85.9 87.7 - 84.7 %    MPV 8.2 6.8 - 10.7 fL    Platelet 255 150 - 450 10*9/L    Neutrophils % 54.4 %    Lymphocytes % 35.6 %    Monocytes % 7.9 %    Eosinophils % 1.6 %    Basophils % 0.5 %    Absolute Neutrophils 3.5 1.8 - 7.8 10*9/L    Absolute Lymphocytes 2.3 1.1 - 3.6 10*9/L    Absolute Monocytes 0.5 0.3 - 0.8 10*9/L    Absolute Eosinophils 0.1 0.0 - 0.5 10*9/L    Absolute Basophils 0.0 0.0 - 0.1 10*9/L              [1]   Current Outpatient Medications   Medication Sig Dispense Refill    dupilumab  (DUPIXENT  PEN) 300 mg/2 mL pen injector Inject the contents of 1 pen (300 mg) under the skin every fourteen (14) days. Maintenance 4 mL 12    empty container Misc Use as directed to dispose of Dupixent  pens. 1 each 2    escitalopram  oxalate (LEXAPRO ) 10 MG tablet TAKE 1 TABLET BY MOUTH ONCE DAILY 90 tablet 4    estradiol  (VAGIFEM ) 10 mcg vaginal tablet Use one tab in vagina nightly for 2 weeks, then twice weekly 18 tablet 8    estradiol  (VIVELLE ) 0.05 mg/24 hr Place 1 patch on the skin Two (2) times a week. 24 patch 3    fexofenadine HCl (ALLEGRA ORAL) Take 180 mg by mouth in the morning.      gabapentin  (NEURONTIN ) 100 MG capsule Take two capsules nightly before bed. 180 capsule 1    hydrOXYzine  (ATARAX ) 25 MG tablet Take one tablet  up to twice daily as needed for itch. 60 tablet 0    naproxen  (NAPROSYN ) 500 MG tablet 1 tab As needed for onset of migraine 20 tablet 11    olmesartan  (BENICAR ) 5 MG tablet Take 1 tablet (5 mg total) by mouth daily. 90 tablet 4 progesterone  (PROMETRIUM ) 200 MG capsule Take 1 capsule (200 mg total) by mouth nightly. 90 capsule 3    rizatriptan  (MAXALT ) 10 MG tablet TAKE 1 TABLET BY MOUTH FOR MIGRAINE. MAY REPEAT IN 2 HOURS. USE NO MORE THAN 2 TABS/24 HOURS OR 2-3 DAYS/WEEK. 15 tablet 12    rosuvastatin  (CRESTOR ) 20 MG tablet TAKE 1 TABLET BY MOUTH ONCE EVERY EVENING 90 tablet 4    semaglutide  (OZEMPIC ) 1 mg/dose (4 mg/3 mL) PnIj injection Inject 1 mg under the skin every seven (7) days. 9 mL 4    fluticasone (FLONASE) 50 mcg/actuation nasal spray 2 sprays by Each Nare route daily. 16 g 11     No current facility-administered medications for this visit.   [2]   Allergies  Allergen Reactions    Shellfish Containing Products Nausea And Vomiting    Sulfa (Sulfonamide Antibiotics) Rash

## 2024-08-23 DIAGNOSIS — N950 Postmenopausal bleeding: Principal | ICD-10-CM

## 2024-08-23 NOTE — Assessment & Plan Note (Signed)
 In setting of MHT  I personally reviewed the US  images. Uniform endometrial thickening to max of 6mm.   Discussed with benign EMB this is reassuring that there is no hyperplasia or malignancy. Discussed very small chance that EMB could have missed hyperplasia/malignancy. Reassuring that bleeding has stopped now that she is using an estrogen patch that is more reliable.  Patient is interested in increasing MHT dose due to continued night sweats. Reviewed in this case it may beneficial to undergo hysteroscopy, D+C in order to ensure it is safe to increase estrogen dosing.   After discussion of options she would like to proceed with hysteroscopy, D+C. Discussed anticipated outpatient procedure, associated risks, need for unplanned procedures as well as return precautions. Discussed activty restriction of pelvic rest x2 weeks. Reviewed role of residents and medical students in OR  Has recent labs, A1c and Cr normal. No additional preop testing needed  Ozempic  to be held for 1 week prior to OR  Request case with next available [facility]

## 2024-08-24 DIAGNOSIS — R21 Rash and other nonspecific skin eruption: Principal | ICD-10-CM

## 2024-08-24 DIAGNOSIS — L299 Pruritus, unspecified: Principal | ICD-10-CM

## 2024-08-24 DIAGNOSIS — L281 Prurigo nodularis: Principal | ICD-10-CM

## 2024-08-24 MED ORDER — GABAPENTIN 100 MG CAPSULE
ORAL_CAPSULE | ORAL | 1 refills | 0.00000 days | Status: CP
Start: 2024-08-24 — End: ?

## 2024-09-05 MED ORDER — OZEMPIC 2 MG/DOSE (8 MG/3 ML) SUBCUTANEOUS PEN INJECTOR
SUBCUTANEOUS | 4 refills | 84.00000 days | Status: CP
Start: 2024-09-05 — End: ?
  Filled 2024-09-11: qty 3, 28d supply, fill #0

## 2024-09-13 ENCOUNTER — Encounter
Admit: 2024-09-13 | Discharge: 2024-09-13 | Payer: BLUE CROSS/BLUE SHIELD | Attending: "Women's Health Care | Primary: "Women's Health Care

## 2024-09-13 DIAGNOSIS — N950 Postmenopausal bleeding: Principal | ICD-10-CM

## 2024-09-13 DIAGNOSIS — N951 Menopausal and female climacteric states: Principal | ICD-10-CM

## 2024-09-13 LAB — ESTRADIOL(ESTROGEN) LEVEL: ESTRADIOL LEVEL: 34.6 pg/mL

## 2024-09-13 NOTE — Progress Notes (Signed)
 Outpatient Gynecology Note - Established Patient    ASSESSMENT AND PLAN     Problem List Items Addressed This Visit          Other    Postmenopausal bleeding    Has stopped since starting progesterone . Transvaginal ultrasound showed 6 mm stripe which is consistent with current HRT. Patient denied D and C and understands that although the risk is low there is a possibility of the increased thickness progressing to endometrial pathology. Will continue to monitor.         Vasomotor symptoms due to menopause - Primary    Not improved. Discussed concerns with increasing estrogen therapy in regard to previous vaginal bleeding and 6 mm stripe. Will get estrogen levels to ensure that she is absorbing the estrogen from the patch well. Otherwise, we discussed other treatment modalities such as CBT and SSRIs to improve night sweats. Other etiologies were also considered such as hypoglycemic episodes at night. She has T2DM and is currently well controlled on Ozempic  with her last A1C being 5.5. If she is underabsorbing from the patch could switch to a pill or increase her estrogen dose.  - Get Estradiol  level         Relevant Orders    Estradiol      No follow-ups on file.    Penne Ruth    SUBJECTIVE     This 58 y.o. [id number] is an established [facility] patient who presents for a problem visit.    HPI:  Ms. Silvano is a 58 yo F presenting for follow up on vasomotor symptoms suspected 2/2 menopause. She reports improvement in migraines and brain fog from before starting HRT. Although she reports no improvement in her night sweats which are occurring 6 out of 7 nights per week approximately 1-3 times per night. She reports that her shirt and underwear are completely soaked through and need to be changed during these episodes. Since starting the progesterone  pill she has had no further vaginal bleeding.     At her last visit she discussed getting a D and C for the increased vaginal stripe seen on vaginal ultrasound of 6 mm. At the time she was okay with moving forward with this treatment, but has since changed her mind and would prefer to avoid this procedure.     Past medical and surgical history was reviewed and updated.    Current Medications[1]    Allergies[2]    REVIEW OF SYSTEMS: See HPI; remaining balance of 10 systems are negative/non-contributory.    OBJECTIVE     BP 134/95  - Pulse 70  - Wt 69.4 kg (152 lb 14.4 oz)  - LMP 04/12/2022 Comment: last period 9-10 months prior - BMI 29.86 kg/m??   General: well-appearing and in NAD  Pulm: Normal work of breathing  Cardio: RR  Psych: pleasant and interactive, answers questions appropriately      LABS     Results for orders placed or performed in visit on 07/18/24   Comprehensive Metabolic Panel   Result Value Ref Range    Sodium 139 135 - 145 mmol/L    Potassium 4.3 3.4 - 4.8 mmol/L    Chloride 104 98 - 107 mmol/L    CO2 26.0 20.0 - 31.0 mmol/L    Anion Gap 9 5 - 14 mmol/L    BUN 11 9 - 23 mg/dL    Creatinine 9.38 9.44 - 1.02 mg/dL    BUN/Creatinine Ratio 18     eGFR CKD-EPI (2021) Female >  90 >=60 mL/min/1.59m2    Glucose 84 70 - 179 mg/dL    Calcium 9.3 8.7 - 89.5 mg/dL    Albumin 4.2 3.4 - 5.0 g/dL    Total Protein 7.1 5.7 - 8.2 g/dL    Total Bilirubin 0.5 0.3 - 1.2 mg/dL    AST 22 <=65 U/L    ALT 23 10 - 49 U/L    Alkaline Phosphatase 66 46 - 116 U/L   Lipid Panel   Result Value Ref Range    Cholesterol, Total 131 <200 mg/dL    Cholesterol, HDL 62 >50 mg/dL    Cholesterol, LDL, Calculated 58 <100 mg/dL    Cholesterol, Non-HDL, Calculated 69 <130 mg/dL    Triglycerides 81 <849 mg/dL    Fasting No    Hemoglobin A1c   Result Value Ref Range    Hemoglobin A1C 5.5 4.8 - 5.6 %    Estimated Average Glucose 111 mg/dL   CBC w/ Differential   Result Value Ref Range    WBC 6.5 3.6 - 11.2 10*9/L    RBC 4.64 3.95 - 5.13 10*12/L    HGB 13.8 11.3 - 14.9 g/dL    HCT 59.4 65.9 - 55.9 %    MCV 87.4 77.6 - 95.7 fL    MCH 29.7 25.9 - 32.4 pg    MCHC 34.0 32.0 - 36.0 g/dL    RDW 85.9 87.7 - 84.7 %    MPV 8.2 6.8 - 10.7 fL    Platelet 255 150 - 450 10*9/L    Neutrophils % 54.4 %    Lymphocytes % 35.6 %    Monocytes % 7.9 %    Eosinophils % 1.6 %    Basophils % 0.5 %    Absolute Neutrophils 3.5 1.8 - 7.8 10*9/L    Absolute Lymphocytes 2.3 1.1 - 3.6 10*9/L    Absolute Monocytes 0.5 0.3 - 0.8 10*9/L    Absolute Eosinophils 0.1 0.0 - 0.5 10*9/L    Absolute Basophils 0.0 0.0 - 0.1 10*9/L        Penne Ruth, MS3         [1]   Current Outpatient Medications   Medication Sig Dispense Refill    dupilumab  (DUPIXENT  PEN) 300 mg/2 mL pen injector Inject the contents of 1 pen (300 mg) under the skin every fourteen (14) days. Maintenance 4 mL 12    empty container Misc Use as directed to dispose of Dupixent  pens. 1 each 2    escitalopram  oxalate (LEXAPRO ) 10 MG tablet TAKE 1 TABLET BY MOUTH ONCE DAILY 90 tablet 4    estradiol  (VAGIFEM ) 10 mcg vaginal tablet Use one tab in vagina nightly for 2 weeks, then twice weekly 18 tablet 8    fexofenadine HCl (ALLEGRA ORAL) Take 180 mg by mouth in the morning.      fluticasone (FLONASE) 50 mcg/actuation nasal spray 2 sprays by Each Nare route daily. 16 g 11    gabapentin  (NEURONTIN ) 100 MG capsule Take two capsules nightly before bed. 180 capsule 1    hydrOXYzine  (ATARAX ) 25 MG tablet Take one tablet up to twice daily as needed for itch. 60 tablet 0    naproxen  (NAPROSYN ) 500 MG tablet 1 tab As needed for onset of migraine 20 tablet 11    olmesartan  (BENICAR ) 5 MG tablet Take 1 tablet (5 mg total) by mouth daily. 90 tablet 4    progesterone  (PROMETRIUM ) 200 MG capsule Take 1 capsule (200 mg total) by mouth nightly. 90 capsule  3    rizatriptan  (MAXALT ) 10 MG tablet TAKE 1 TABLET BY MOUTH FOR MIGRAINE. MAY REPEAT IN 2 HOURS. USE NO MORE THAN 2 TABS/24 HOURS OR 2-3 DAYS/WEEK. 15 tablet 12    rosuvastatin  (CRESTOR ) 20 MG tablet TAKE 1 TABLET BY MOUTH ONCE EVERY EVENING 90 tablet 4    semaglutide  (OZEMPIC ) 2 mg/dose (8 mg/3 mL) PnIj Inject 2 mg under the skin every seven (7) days. 9 mL 4     No current facility-administered medications for this visit.   [2]   Allergies  Allergen Reactions    Shellfish Containing Products Nausea And Vomiting    Sulfa (Sulfonamide Antibiotics) Rash

## 2024-09-13 NOTE — Assessment & Plan Note (Signed)
 Has stopped since starting progesterone . Transvaginal ultrasound showed 6 mm stripe which is consistent with current HRT. Patient denied D and C and understands that although the risk is low there is a possibility of the increased thickness progressing to endometrial pathology. Will continue to monitor.

## 2024-09-13 NOTE — Progress Notes (Signed)
 Outpatient Gynecology Note - Established Patient    ASSESSMENT AND PLAN     Problem List Items Addressed This Visit          Obstetric Problems    Postmenopausal bleeding    Has stopped since starting progesterone . Transvaginal ultrasound showed 6 mm stripe which is consistent with current HRT. Patient denied D and C and understands that although the risk is low there is a possibility of the increased thickness progressing to endometrial pathology. Will continue to monitor.         Vasomotor symptoms due to menopause - Primary    Not improved. Discussed concerns with increasing estrogen therapy in regard to previous vaginal bleeding and 6 mm stripe. Will get estrogen levels to ensure that she is absorbing the estrogen from the patch well. Otherwise, we discussed other treatment modalities such as CBT and SSRIs to improve night sweats. Other etiologies were also considered such as hypoglycemic episodes at night. She has T2DM and is currently well controlled on Ozempic  with her last A1C being 5.5. If she is underabsorbing from the patch could consider dosage change.  - Get Estradiol  level         Relevant Orders    Estradiol      No follow-ups on file.    Ginnie GORMAN Pac, [facility]  I personally spent 20 minutes face-to-face and non-face-to-face in the care of this patient, which includes all pre, intra, and post visit time on the date of service.        SUBJECTIVE     This 58 y.o. [id number] is an established [facility] patient who presents for a problem visit.    HPI:  Pt is here to discuss her on-going Ssevere night time sweats, 1-3 times a night, 6 out of 7 nights. She is using her transdermal 0.05mg  patch and 200mg  nightly progesterone . No further bleeding.  She is not using vaginal estrogen.         Past medical and surgical history was reviewed and updated.    Current Medications[1]    Allergies[2]    REVIEW OF SYSTEMS: See HPI; remaining balance of 10 systems are negative/non-contributory.    OBJECTIVE     BP 134/95  - Pulse 70  - Wt 69.4 kg (152 lb 14.4 oz)  - LMP 04/12/2022 Comment: last period 9-10 months prior - BMI 29.86 kg/m??   General: well-appearing and in NAD  Psych: pleasant and interactive, answers questions appropriately    A chaperone was present for any sensitive portions of the exam (if performed) including but not limited to, breast and pelvic examination.  If performed, consent was obtained from patient prior to the sensitive examination.  If medical students were involved, explicit additional consent was obtained.     LABS     Results for orders placed or performed in visit on 07/18/24   Comprehensive Metabolic Panel   Result Value Ref Range    Sodium 139 135 - 145 mmol/L    Potassium 4.3 3.4 - 4.8 mmol/L    Chloride 104 98 - 107 mmol/L    CO2 26.0 20.0 - 31.0 mmol/L    Anion Gap 9 5 - 14 mmol/L    BUN 11 9 - 23 mg/dL    Creatinine 9.38 9.44 - 1.02 mg/dL    BUN/Creatinine Ratio 18     eGFR CKD-EPI (2021) Female >90 >=60 mL/min/1.73m2    Glucose 84 70 - 179 mg/dL    Calcium 9.3 8.7 - 89.5 mg/dL  Albumin 4.2 3.4 - 5.0 g/dL    Total Protein 7.1 5.7 - 8.2 g/dL    Total Bilirubin 0.5 0.3 - 1.2 mg/dL    AST 22 <=65 U/L    ALT 23 10 - 49 U/L    Alkaline Phosphatase 66 46 - 116 U/L   Lipid Panel   Result Value Ref Range    Cholesterol, Total 131 <200 mg/dL    Cholesterol, HDL 62 >50 mg/dL    Cholesterol, LDL, Calculated 58 <100 mg/dL    Cholesterol, Non-HDL, Calculated 69 <130 mg/dL    Triglycerides 81 <849 mg/dL    Fasting No    Hemoglobin A1c   Result Value Ref Range    Hemoglobin A1C 5.5 4.8 - 5.6 %    Estimated Average Glucose 111 mg/dL   CBC w/ Differential   Result Value Ref Range    WBC 6.5 3.6 - 11.2 10*9/L    RBC 4.64 3.95 - 5.13 10*12/L    HGB 13.8 11.3 - 14.9 g/dL    HCT 59.4 65.9 - 55.9 %    MCV 87.4 77.6 - 95.7 fL    MCH 29.7 25.9 - 32.4 pg    MCHC 34.0 32.0 - 36.0 g/dL    RDW 85.9 87.7 - 84.7 %    MPV 8.2 6.8 - 10.7 fL    Platelet 255 150 - 450 10*9/L    Neutrophils % 54.4 %    Lymphocytes % 35.6 %    Monocytes % 7.9 %    Eosinophils % 1.6 %    Basophils % 0.5 %    Absolute Neutrophils 3.5 1.8 - 7.8 10*9/L    Absolute Lymphocytes 2.3 1.1 - 3.6 10*9/L    Absolute Monocytes 0.5 0.3 - 0.8 10*9/L    Absolute Eosinophils 0.1 0.0 - 0.5 10*9/L    Absolute Basophils 0.0 0.0 - 0.1 10*9/L               [1]   Current Outpatient Medications   Medication Sig Dispense Refill    dupilumab  (DUPIXENT  PEN) 300 mg/2 mL pen injector Inject the contents of 1 pen (300 mg) under the skin every fourteen (14) days. Maintenance 4 mL 12    empty container Misc Use as directed to dispose of Dupixent  pens. 1 each 2    escitalopram  oxalate (LEXAPRO ) 10 MG tablet TAKE 1 TABLET BY MOUTH ONCE DAILY 90 tablet 4    estradiol  (VAGIFEM ) 10 mcg vaginal tablet Use one tab in vagina nightly for 2 weeks, then twice weekly 18 tablet 8    fexofenadine HCl (ALLEGRA ORAL) Take 180 mg by mouth in the morning.      fluticasone (FLONASE) 50 mcg/actuation nasal spray 2 sprays by Each Nare route daily. 16 g 11    gabapentin  (NEURONTIN ) 100 MG capsule Take two capsules nightly before bed. 180 capsule 1    hydrOXYzine  (ATARAX ) 25 MG tablet Take one tablet up to twice daily as needed for itch. 60 tablet 0    naproxen  (NAPROSYN ) 500 MG tablet 1 tab As needed for onset of migraine 20 tablet 11    olmesartan  (BENICAR ) 5 MG tablet Take 1 tablet (5 mg total) by mouth daily. 90 tablet 4    progesterone  (PROMETRIUM ) 200 MG capsule Take 1 capsule (200 mg total) by mouth nightly. 90 capsule 3    rizatriptan  (MAXALT ) 10 MG tablet TAKE 1 TABLET BY MOUTH FOR MIGRAINE. MAY REPEAT IN 2 HOURS. USE NO MORE THAN 2 TABS/24 HOURS  OR 2-3 DAYS/WEEK. 15 tablet 12    rosuvastatin  (CRESTOR ) 20 MG tablet TAKE 1 TABLET BY MOUTH ONCE EVERY EVENING 90 tablet 4    semaglutide  (OZEMPIC ) 2 mg/dose (8 mg/3 mL) PnIj Inject 2 mg under the skin every seven (7) days. 9 mL 4     No current facility-administered medications for this visit.   [2]   Allergies  Allergen Reactions    Shellfish Containing Products Nausea And Vomiting    Sulfa (Sulfonamide Antibiotics) Rash

## 2024-09-13 NOTE — Assessment & Plan Note (Signed)
 Not improved. Discussed concerns with increasing estrogen therapy in regard to previous vaginal bleeding and 6 mm stripe. Will get estrogen levels to ensure that she is absorbing the estrogen from the patch well. Otherwise, we discussed other treatment modalities such as CBT and SSRIs to improve night sweats. Other etiologies were also considered such as hypoglycemic episodes at night. She has T2DM and is currently well controlled on Ozempic  with her last A1C being 5.5. If she is underabsorbing from the patch could switch to a pill or increase her estrogen dose.  - Get Estradiol  level

## 2024-09-14 DIAGNOSIS — N951 Menopausal and female climacteric states: Principal | ICD-10-CM

## 2024-09-14 NOTE — Telephone Encounter (Signed)
 Nurse called patient from [facility] at [facility] Nurse Triage line with message from provider Ginnie Pac, [facility]:    Please tell Bonnie Harvey that here estradiol  level came back borderline at 34.5, usually just over 40 provides optimal symptom relief.  Also, the testing itself has some variability.  However,  I will send an increased dose of her twice weekly Vivelle  Dot.  She needs to use it for 3 months prior to assessing symptom response.  She may have some spotting initially when changing doses.  However, if bleeding after 3 months, she needs to make a repeat MD visit to revisit possible hysteroscopy.    Pt verbalizes understanding, no further questions.

## 2024-09-15 MED ORDER — ESTRADIOL 0.075 MG/24 HR SEMIWEEKLY TRANSDERMAL PATCH
MEDICATED_PATCH | TRANSDERMAL | 1 refills | 84.00000 days | Status: CP
Start: 2024-09-15 — End: ?

## 2024-10-04 NOTE — Progress Notes (Signed)
 Specialty Medication(s): Dupixent     Bonnie Harvey has been dis-enrolled from the [facility] specialty pharmacy services as a result of patient doing well off therapy.    Additional information provided to the patient: spoke with patient 12/23 and she's doing well. She'll let us  know if things begin to flare/if she needs to restart.     Bonnie Harvey [name] Specialty Pharmacist

## 2024-10-07 MED ORDER — ROSUVASTATIN 20 MG TABLET
ORAL_TABLET | ORAL | 3 refills | 0.00000 days | Status: CP
Start: 2024-10-07 — End: ?

## 2024-10-14 MED FILL — OZEMPIC 2 MG/DOSE (8 MG/3 ML) SUBCUTANEOUS PEN INJECTOR: SUBCUTANEOUS | 28 days supply | Qty: 3 | Fill #1

## 2024-11-08 MED FILL — OZEMPIC 2 MG/DOSE (8 MG/3 ML) SUBCUTANEOUS PEN INJECTOR: SUBCUTANEOUS | 28 days supply | Qty: 3 | Fill #2
